# Patient Record
Sex: Male | Born: 1945 | Race: White | Hispanic: No | Smoking: Former smoker
Health system: Southern US, Community
[De-identification: ages and names within clinical notes are randomized; demographics above are authoritative.]

---

## 2006-03-31 ENCOUNTER — Ambulatory Visit (HOSPITAL_COMMUNITY): Admission: RE | Admit: 2006-03-31 | Discharge: 2006-03-31 | Payer: Self-pay | Admitting: Orthopedic Surgery

## 2006-12-28 ENCOUNTER — Ambulatory Visit (HOSPITAL_BASED_OUTPATIENT_CLINIC_OR_DEPARTMENT_OTHER): Admission: RE | Admit: 2006-12-28 | Discharge: 2006-12-28 | Payer: Self-pay | Admitting: Urology

## 2007-07-21 ENCOUNTER — Encounter: Admission: RE | Admit: 2007-07-21 | Discharge: 2007-07-21 | Payer: Self-pay | Admitting: Family Medicine

## 2007-07-23 ENCOUNTER — Inpatient Hospital Stay (HOSPITAL_COMMUNITY): Admission: EM | Admit: 2007-07-23 | Discharge: 2007-07-25 | Payer: Self-pay | Admitting: Emergency Medicine

## 2010-12-08 NOTE — Op Note (Signed)
Raymond Hunter, Raymond Hunter                   ACCOUNT NO.:  [id number]   MEDICAL RECORD NO.:  [id number]          PATIENT TYPE:  AMB   LOCATION:  [facility]                         FACILITY:  [facility]   PHYSICIAN:  Valetta Fuller, M.D.  DATE OF BIRTH:  April 04, 1946   DATE OF PROCEDURE:  12/28/2006  DATE OF DISCHARGE:                               OPERATIVE REPORT   PREOPERATIVE DIAGNOSIS:  Bladder neck obstruction, elevated postvoid  residual.   POSTOPERATIVE DIAGNOSIS:  Bladder neck obstruction, elevated postvoid  residual.   PROCEDURE:  1. Cystoscopy with transurethral incision of bladder neck and      prostate.  2. Urethral dilation.   SURGEON:  Valetta Fuller, M.D.   RESIDENT:  Terie Purser, MD   ANESTHESIA:  General.   COMPLICATIONS:  None.   DRAINS:  20-French Foley catheter.   DISPOSITION:  Stable to post anesthesia care unit.   INDICATIONS FOR PROCEDURE:  Mr. Bromell is a 65 year old gentleman who was  originally seen for evaluation of hematuria.  During his evaluation he  was found to have a significantly distended bladder.  He has been  evaluated with imaging and urodynamic study.  He has cystoscopic  evidence of a narrowed bladder neck.  He also has findings of hypotonic  bladder.  He has been on medical therapy.  He was counseled regarding  transurethral incision of the prostate.  He has agreed to proceed after  a full discussion of benefits, risks of procedure.   DESCRIPTION OF PROCEDURE:  The patient was brought to the operating room  properly identified.  Time-out performed to confirm correct patient and  procedure.  Administered general anesthesia given preoperative  antibiotics and placed in dorsal lithotomy position and prepped and  draped sterile fashion.  He did have a tight meatus that was unable to  accept the 28-French resectoscope.  We therefore serially dilated the  urethra using Sissy Hoff sounds to 30-French.  At this point we then did  introduce the 33 French  resectoscope into the bladder.  The bladder was  then carefully evaluated.  Both ureteral orifices were in their normal  anatomic position and both effluxing clear urine.  He did have a very  large capacious bladder with the capacity of approximately 2 liters.  We  then examined the bladder neck and prostate.  He did have a ridge at the  bladder neck.  He did not have significant lateral obstruction in the  prostatic urethra.  The location of the ureteral orifice was noted.   We then proceeded with transurethral incision.  This was performed with  the [employer].  We made a cut at the 6 o'clock position through the  ridge of the bladder neck and this was carried down to the level of the  verumontanum.  We continued to do this until the bladder neck opened  nicely.  Hemostasis was obtained.  We then carefully examined the  posterior urethra.  The channel was wide open and had a flat appearance  from the verumontanum into the bladder.  At this point the scope  was  removed and a Foley catheter was placed easily.  Return of clear urine  was noted.  The patient was then awoken from anesthesia and transported  to room in stable condition.  There were no complications throughout the  case.  Please note Dr. Isabel Caprice was present and participated in all  aspects of this procedure as primary surgeon.     ______________________________  Terie Purser, MD      Valetta Fuller, M.D.  Electronically Signed    JH/MEDQ  D:  12/28/2006  T:  12/29/2006  Job:  [id number]

## 2010-12-08 NOTE — Consult Note (Signed)
Raymond Hunter, HAGINS                   ACCOUNT NO.:  [id number]   MEDICAL RECORD NO.:  [id number]          PATIENT TYPE:  INP   LOCATION:  1418                         FACILITY:  [facility]   PHYSICIAN:  Heloise Purpura, MD      DATE OF BIRTH:  10-Apr-1946   DATE OF CONSULTATION:  07/23/2007  DATE OF DISCHARGE:                                 CONSULTATION   REQUESTING PHYSICIAN:  Dr. Valma Cava.   REASON FOR CONSULTATION:  Right testicular pain.   HISTORY:  Raymond Hunter is a 65 year old gentleman who has been followed by  Dr. Isabel Caprice for his urologic care in the past.  He developed right  testicular pain along with right lower back pain which began on December  25.  This subsequently progressed, resulting in the patient presenting  for evaluation by his primary care physician on the 26th.  He underwent  a testicular ultrasound which demonstrated findings consistent with an  epididymitis, and he was begun on Bactrim at that time.  His symptoms  have progressively worsened; and due to increased pain and discomfort,  he returned to the emergency department this morning for further  evaluation.  He denies any dysuria, urinary urgency, frequency, or  hematuria.  He again has had some low back pain but no flank pain.  He  denies nausea, vomiting, or fever.  He denies a history of  nephrolithiasis or GU malignancy.  He does have a history of urinary  retention, and has undergone a transurethral incision of the prostate by  Dr. Isabel Caprice in April.   PAST MEDICAL HISTORY:  1. Diabetes.  2. Arthritis.  3. Dyslipidemia.   PAST SURGICAL HISTORY:  1. Back surgery.  2. Transurethral incision of the prostate.   MEDICATIONS:  1. Metformin.  2. Glipizide.  3. Cyclobenzaprine.  4. Bactrim.  5. Vicodin.   ALLERGIES:  SUDAFED which resulted in extreme dizziness   FAMILY HISTORY:  No GU malignancy.   SOCIAL HISTORY:  The patient quit smoking approximately 40 years ago.   REVIEW OF SYSTEMS:  All systems  are reviewed and are negative except as  in the history.   PHYSICAL EXAM:  VITAL SIGNS:  Temperature 98.6, respirations 20, blood  pressure 151/96, heart rate 101.  CONSTITUTIONAL:  Well-nourished, well-developed, age-appropriate male in  no acute distress.  HEENT:  Normocephalic, atraumatic.  CARDIOVASCULAR:  Regular rate and rhythm.  LUNGS:  Normal respiratory effort.  ABDOMEN:  Mildly obese, soft, nontender, and nondistended without  abdominal masses.  BACK:  No CVA tenderness.  GENITOURINARY:  The patient has a normal male phallus and normal  urethral meatus.  No penile lesions.  The left testis is palpably normal  without evidence for mass or tenderness.  Normal epididymis on the left.  The right testicle is significantly swollen.  There is no induration.  The right testicle is exquisitely tender on palpation along with the  right epididymis.  There are no scrotal skin abnormalities.  DIGITAL RECTAL EXAM:  Normal sphincter tone without rectal masses.  The  prostate is nontender and without nodularity.  EXTREMITIES:  No edema.  NEUROLOGIC:  Grossly intact.  PSYCHIATRIC:  Normal mood and affect   LABORATORY DATA:  Urinalysis--too numerous count white blood cells, 3-6  red blood cells, and many bacteria; this has been sent for urine  culture.  White blood count is 17.8.  blood glucose is elevated at 197,  and serum creatinine is 1.17.   RADIOLOGIC IMAGING:  The patient did undergo scrotal ultrasonography  both on December 26 and, again, on December 28.  His ultrasound on  December 26, again, demonstrated heterogeneous right epididymis with  enlargement consistent with epididymitis.  No obvious abscess was seen.  These findings were also consistent with the patient's ultrasound today.  Again, no obvious abscess was identified.   IMPRESSION:  Complicated epididymo-orchitis.   PLAN:  Due to the fact that the patient has not been clinically  improving on antibiotic therapy, as  well as the fact that he is  immunocompromised being a diabetic, I have recommended that he be placed  in the hospital for broad spectrum intravenous antibiotic therapy.  A  urine culture has been sent.  Dr. Isabel Caprice will be notified of the  patient's admission.      Heloise Purpura, MD  Electronically Signed     LB/MEDQ  D:  07/23/2007  T:  07/24/2007  Job:  [id number]   cc:   Dr. Valma Cava

## 2010-12-11 NOTE — Discharge Summary (Signed)
Raymond Hunter, Raymond Hunter                   ACCOUNT NO.:  [id number]   MEDICAL RECORD NO.:  [id number]          PATIENT TYPE:  INP   LOCATION:  1418                         [facility]ACILITY[facility]   PHYSICIAN:  Valetta Fuller, M.D.  DATE OF BIRTH:  Jan 26, 1946   DATE OF ADMISSION:  07/23/2007  DATE OF DISCHARGE:  07/25/2007                               DISCHARGE SUMMARY   DISCHARGE DIAGNOSIS:  1. Epididymal orchitis.  2. Diabetes mellitus type 2.  3. Urinary retention with neurogenic bladder.   HOSPITAL COURSE:  Raymond Hunter is a 65 year old male.  We have followed him  for some time with problems with voiding dysfunction.  The patient  previously had urinary retention.  He had very large postvoid residuals.  A one point the patient had as much as 2 liters in his bladder.  Urodynamics revealed a hypotonic acontractile bladder.  There was also  some potential component of obstruction.   Back in June of 2008, the patient underwent a transurethral incision of  his prostate and bladder neck.  This resulted in improved voiding.  He  is still known to have postvoid residuals in the 500-600 mL range, but  is otherwise doing relatively well clinically.  Last time he was seen in  the office, things were relatively stable.  The patient was admitted by  my partner Dr. Laverle Patter.  He had developed a right-sided testicular  discomfort along with some low-back pain beginning 2-3 days prior to  admission.  He was evaluated by his primary care physician 2 days prior  to admission, and had a testicular ultrasound which showed findings  consistent with epididymitis, and he was started on Bactrim.  The  patient's symptoms progressively worsened with increased pain and  swelling.  He presented to the [facility] Emergency Room where he was  assessed by Dr. Laverle Patter.  There, he was found to be afebrile.  White  blood cell count, however, was elevated at 17,800.  His blood sugar was  197.  Repeat scrotal ultrasound was,  again, performed.  This showed some  heterogenicity of the right epididymis with enlargement and increased  blood flow consistent with epididymitis.  No evidence of an abscess.  The patient was admitted for broad-spectrum antibiotics.  Clinically he  was felt to have epididymal orchitis.   The patient was admitted and started on ciprofloxacin as well as some  supportive care.  The patient voided about 200 mL, and had a postvoid  residual of approximately 1000 mL.  For that reason a Foley catheter was  inserted and approximately 1000 mL of urine was obtained.  I saw the  patient for the first time on hospital day #1.  At that time he was  feeling better.  He remained afebrile with normal vital signs.  He  continued to have an enlarged and tender right testis and epididymis.  White blood cell count was down to 15,000 and blood sugars were down to  the 140-150 range.  The patient subsequently had his catheter removed,  and was able to void.  He was restarted  on his Flomax..   On hospital day #2 he remained afebrile with improved testicular  discomfort.  His urine culture actually was negative.  The patient was  discharged on July 25, 2007.  He was clinically improved at that  time.   DISPOSITION:  The patient was discharged to home with a 10-day course of  ciprofloxacin.  He will remain on his Flomax.  He will follow up with me  in approximately 2 weeks.           ______________________________  Valetta Fuller, M.D.  Electronically Signed     DSG/MEDQ  D:  09/17/2007  T:  09/18/2007  Job:  [id number]

## 2011-04-30 LAB — URINALYSIS, ROUTINE W REFLEX MICROSCOPIC
Bilirubin Urine: NEGATIVE
Protein, ur: 30 — AB
Urobilinogen, UA: 0.2

## 2011-04-30 LAB — URINE MICROSCOPIC-ADD ON

## 2011-04-30 LAB — COMPREHENSIVE METABOLIC PANEL
ALT: 17
Alkaline Phosphatase: 83
BUN: 15
CO2: 22
Creatinine, Ser: 1.17
Glucose, Bld: 197 — ABNORMAL HIGH

## 2011-04-30 LAB — CBC
Hemoglobin: 14.9
Platelets: 171
Platelets: 214
RBC: 4.26
RBC: 4.73
RDW: 12.7

## 2011-04-30 LAB — DIFFERENTIAL
Basophils Absolute: 0
Basophils Absolute: 0.1
Basophils Relative: 0
Eosinophils Absolute: 0.1
Eosinophils Relative: 1
Lymphocytes Relative: 18
Lymphs Abs: 2.7
Monocytes Absolute: 1.3 — ABNORMAL HIGH
Monocytes Absolute: 1.4 — ABNORMAL HIGH
Monocytes Relative: 9
Neutro Abs: 13 — ABNORMAL HIGH
Neutrophils Relative %: 72
Neutrophils Relative %: 73

## 2011-04-30 LAB — URINE CULTURE: Colony Count: 2000

## 2011-05-13 LAB — POCT I-STAT 4, (NA,K, GLUC, HGB,HCT)
Glucose, Bld: 222 — ABNORMAL HIGH
HCT: 48
Hemoglobin: 16.3
Operator id: 114531

## 2014-08-01 DIAGNOSIS — G309 Alzheimer's disease, unspecified: Secondary | ICD-10-CM | POA: Diagnosis not present

## 2014-08-01 DIAGNOSIS — L820 Inflamed seborrheic keratosis: Secondary | ICD-10-CM | POA: Diagnosis not present

## 2014-09-13 DIAGNOSIS — S79912A Unspecified injury of left hip, initial encounter: Secondary | ICD-10-CM | POA: Diagnosis not present

## 2014-09-13 DIAGNOSIS — R739 Hyperglycemia, unspecified: Secondary | ICD-10-CM | POA: Diagnosis not present

## 2014-09-13 DIAGNOSIS — G8929 Other chronic pain: Secondary | ICD-10-CM | POA: Diagnosis not present

## 2014-09-13 DIAGNOSIS — M25552 Pain in left hip: Secondary | ICD-10-CM | POA: Diagnosis not present

## 2014-09-13 DIAGNOSIS — R7309 Other abnormal glucose: Secondary | ICD-10-CM | POA: Diagnosis not present

## 2014-09-13 DIAGNOSIS — M6282 Rhabdomyolysis: Secondary | ICD-10-CM | POA: Diagnosis not present

## 2014-09-13 DIAGNOSIS — N390 Urinary tract infection, site not specified: Secondary | ICD-10-CM | POA: Diagnosis not present

## 2014-09-13 DIAGNOSIS — E1165 Type 2 diabetes mellitus with hyperglycemia: Secondary | ICD-10-CM | POA: Diagnosis not present

## 2014-09-13 DIAGNOSIS — M545 Low back pain: Secondary | ICD-10-CM | POA: Diagnosis not present

## 2014-09-13 DIAGNOSIS — S0990XA Unspecified injury of head, initial encounter: Secondary | ICD-10-CM | POA: Diagnosis not present

## 2014-09-13 DIAGNOSIS — S3992XA Unspecified injury of lower back, initial encounter: Secondary | ICD-10-CM | POA: Diagnosis not present

## 2014-09-13 DIAGNOSIS — R509 Fever, unspecified: Secondary | ICD-10-CM | POA: Diagnosis not present

## 2014-09-14 DIAGNOSIS — B952 Enterococcus as the cause of diseases classified elsewhere: Secondary | ICD-10-CM | POA: Diagnosis not present

## 2014-09-14 DIAGNOSIS — I517 Cardiomegaly: Secondary | ICD-10-CM | POA: Diagnosis not present

## 2014-09-14 DIAGNOSIS — G8929 Other chronic pain: Secondary | ICD-10-CM | POA: Diagnosis not present

## 2014-09-14 DIAGNOSIS — R509 Fever, unspecified: Secondary | ICD-10-CM | POA: Diagnosis not present

## 2014-09-14 DIAGNOSIS — S3992XA Unspecified injury of lower back, initial encounter: Secondary | ICD-10-CM | POA: Diagnosis not present

## 2014-09-14 DIAGNOSIS — R262 Difficulty in walking, not elsewhere classified: Secondary | ICD-10-CM | POA: Diagnosis not present

## 2014-09-14 DIAGNOSIS — Z7982 Long term (current) use of aspirin: Secondary | ICD-10-CM | POA: Diagnosis not present

## 2014-09-14 DIAGNOSIS — G9341 Metabolic encephalopathy: Secondary | ICD-10-CM | POA: Diagnosis not present

## 2014-09-14 DIAGNOSIS — R0602 Shortness of breath: Secondary | ICD-10-CM | POA: Diagnosis not present

## 2014-09-14 DIAGNOSIS — G4700 Insomnia, unspecified: Secondary | ICD-10-CM | POA: Diagnosis not present

## 2014-09-14 DIAGNOSIS — M25552 Pain in left hip: Secondary | ICD-10-CM | POA: Diagnosis not present

## 2014-09-14 DIAGNOSIS — R488 Other symbolic dysfunctions: Secondary | ICD-10-CM | POA: Diagnosis not present

## 2014-09-14 DIAGNOSIS — R339 Retention of urine, unspecified: Secondary | ICD-10-CM | POA: Diagnosis not present

## 2014-09-14 DIAGNOSIS — Z23 Encounter for immunization: Secondary | ICD-10-CM | POA: Diagnosis not present

## 2014-09-14 DIAGNOSIS — Z794 Long term (current) use of insulin: Secondary | ICD-10-CM | POA: Diagnosis not present

## 2014-09-14 DIAGNOSIS — N390 Urinary tract infection, site not specified: Secondary | ICD-10-CM | POA: Diagnosis not present

## 2014-09-14 DIAGNOSIS — I471 Supraventricular tachycardia: Secondary | ICD-10-CM | POA: Diagnosis not present

## 2014-09-14 DIAGNOSIS — S0990XA Unspecified injury of head, initial encounter: Secondary | ICD-10-CM | POA: Diagnosis not present

## 2014-09-14 DIAGNOSIS — R9431 Abnormal electrocardiogram [ECG] [EKG]: Secondary | ICD-10-CM | POA: Diagnosis not present

## 2014-09-14 DIAGNOSIS — R5383 Other fatigue: Secondary | ICD-10-CM | POA: Diagnosis not present

## 2014-09-14 DIAGNOSIS — R2689 Other abnormalities of gait and mobility: Secondary | ICD-10-CM | POA: Diagnosis not present

## 2014-09-14 DIAGNOSIS — S79912A Unspecified injury of left hip, initial encounter: Secondary | ICD-10-CM | POA: Diagnosis not present

## 2014-09-14 DIAGNOSIS — M1990 Unspecified osteoarthritis, unspecified site: Secondary | ICD-10-CM | POA: Diagnosis not present

## 2014-09-14 DIAGNOSIS — M6281 Muscle weakness (generalized): Secondary | ICD-10-CM | POA: Diagnosis not present

## 2014-09-14 DIAGNOSIS — M545 Low back pain: Secondary | ICD-10-CM | POA: Diagnosis not present

## 2014-09-14 DIAGNOSIS — R278 Other lack of coordination: Secondary | ICD-10-CM | POA: Diagnosis not present

## 2014-09-14 DIAGNOSIS — G309 Alzheimer's disease, unspecified: Secondary | ICD-10-CM | POA: Diagnosis not present

## 2014-09-14 DIAGNOSIS — R2681 Unsteadiness on feet: Secondary | ICD-10-CM | POA: Diagnosis not present

## 2014-09-14 DIAGNOSIS — F0390 Unspecified dementia without behavioral disturbance: Secondary | ICD-10-CM | POA: Diagnosis not present

## 2014-09-14 DIAGNOSIS — R338 Other retention of urine: Secondary | ICD-10-CM | POA: Diagnosis not present

## 2014-09-14 DIAGNOSIS — I10 Essential (primary) hypertension: Secondary | ICD-10-CM | POA: Diagnosis not present

## 2014-09-14 DIAGNOSIS — R279 Unspecified lack of coordination: Secondary | ICD-10-CM | POA: Diagnosis not present

## 2014-09-14 DIAGNOSIS — N3001 Acute cystitis with hematuria: Secondary | ICD-10-CM | POA: Diagnosis not present

## 2014-09-14 DIAGNOSIS — N401 Enlarged prostate with lower urinary tract symptoms: Secondary | ICD-10-CM | POA: Diagnosis not present

## 2014-09-14 DIAGNOSIS — Z7401 Bed confinement status: Secondary | ICD-10-CM | POA: Diagnosis not present

## 2014-09-14 DIAGNOSIS — E1165 Type 2 diabetes mellitus with hyperglycemia: Secondary | ICD-10-CM | POA: Diagnosis not present

## 2014-09-14 DIAGNOSIS — T796XXD Traumatic ischemia of muscle, subsequent encounter: Secondary | ICD-10-CM | POA: Diagnosis not present

## 2014-09-14 DIAGNOSIS — R54 Age-related physical debility: Secondary | ICD-10-CM | POA: Diagnosis not present

## 2014-09-14 DIAGNOSIS — M6282 Rhabdomyolysis: Secondary | ICD-10-CM | POA: Diagnosis not present

## 2014-09-14 DIAGNOSIS — G92 Toxic encephalopathy: Secondary | ICD-10-CM | POA: Diagnosis not present

## 2014-09-17 DIAGNOSIS — G4700 Insomnia, unspecified: Secondary | ICD-10-CM | POA: Diagnosis not present

## 2014-09-17 DIAGNOSIS — F0390 Unspecified dementia without behavioral disturbance: Secondary | ICD-10-CM | POA: Diagnosis not present

## 2014-09-17 DIAGNOSIS — I10 Essential (primary) hypertension: Secondary | ICD-10-CM | POA: Diagnosis not present

## 2014-09-17 DIAGNOSIS — G309 Alzheimer's disease, unspecified: Secondary | ICD-10-CM | POA: Diagnosis not present

## 2014-09-17 DIAGNOSIS — G92 Toxic encephalopathy: Secondary | ICD-10-CM | POA: Diagnosis not present

## 2014-09-17 DIAGNOSIS — N400 Enlarged prostate without lower urinary tract symptoms: Secondary | ICD-10-CM | POA: Diagnosis not present

## 2014-09-17 DIAGNOSIS — R2681 Unsteadiness on feet: Secondary | ICD-10-CM | POA: Diagnosis not present

## 2014-09-17 DIAGNOSIS — R2689 Other abnormalities of gait and mobility: Secondary | ICD-10-CM | POA: Diagnosis not present

## 2014-09-17 DIAGNOSIS — N401 Enlarged prostate with lower urinary tract symptoms: Secondary | ICD-10-CM | POA: Diagnosis not present

## 2014-09-17 DIAGNOSIS — M6281 Muscle weakness (generalized): Secondary | ICD-10-CM | POA: Diagnosis not present

## 2014-09-17 DIAGNOSIS — R488 Other symbolic dysfunctions: Secondary | ICD-10-CM | POA: Diagnosis not present

## 2014-09-17 DIAGNOSIS — R5383 Other fatigue: Secondary | ICD-10-CM | POA: Diagnosis not present

## 2014-09-17 DIAGNOSIS — I517 Cardiomegaly: Secondary | ICD-10-CM | POA: Diagnosis not present

## 2014-09-17 DIAGNOSIS — R0602 Shortness of breath: Secondary | ICD-10-CM | POA: Diagnosis not present

## 2014-09-17 DIAGNOSIS — R338 Other retention of urine: Secondary | ICD-10-CM | POA: Diagnosis not present

## 2014-09-17 DIAGNOSIS — R279 Unspecified lack of coordination: Secondary | ICD-10-CM | POA: Diagnosis not present

## 2014-09-17 DIAGNOSIS — R54 Age-related physical debility: Secondary | ICD-10-CM | POA: Diagnosis not present

## 2014-09-17 DIAGNOSIS — R278 Other lack of coordination: Secondary | ICD-10-CM | POA: Diagnosis not present

## 2014-09-17 DIAGNOSIS — E1165 Type 2 diabetes mellitus with hyperglycemia: Secondary | ICD-10-CM | POA: Diagnosis not present

## 2014-09-17 DIAGNOSIS — Z7401 Bed confinement status: Secondary | ICD-10-CM | POA: Diagnosis not present

## 2014-09-17 DIAGNOSIS — N3001 Acute cystitis with hematuria: Secondary | ICD-10-CM | POA: Diagnosis not present

## 2014-10-26 DIAGNOSIS — N400 Enlarged prostate without lower urinary tract symptoms: Secondary | ICD-10-CM | POA: Diagnosis not present

## 2014-10-26 DIAGNOSIS — G309 Alzheimer's disease, unspecified: Secondary | ICD-10-CM | POA: Diagnosis not present

## 2014-10-26 DIAGNOSIS — G4700 Insomnia, unspecified: Secondary | ICD-10-CM | POA: Diagnosis not present

## 2014-10-30 DIAGNOSIS — E119 Type 2 diabetes mellitus without complications: Secondary | ICD-10-CM | POA: Diagnosis not present

## 2014-10-30 DIAGNOSIS — T83498A Other mechanical complication of other prosthetic devices, implants and grafts of genital tract, initial encounter: Secondary | ICD-10-CM | POA: Diagnosis not present

## 2014-10-30 DIAGNOSIS — Z7982 Long term (current) use of aspirin: Secondary | ICD-10-CM | POA: Diagnosis not present

## 2014-10-30 DIAGNOSIS — N3090 Cystitis, unspecified without hematuria: Secondary | ICD-10-CM | POA: Diagnosis not present

## 2014-10-30 DIAGNOSIS — R339 Retention of urine, unspecified: Secondary | ICD-10-CM | POA: Diagnosis not present

## 2014-10-30 DIAGNOSIS — N3080 Other cystitis without hematuria: Secondary | ICD-10-CM | POA: Diagnosis not present

## 2014-10-30 DIAGNOSIS — Z794 Long term (current) use of insulin: Secondary | ICD-10-CM | POA: Diagnosis not present

## 2014-10-30 DIAGNOSIS — I10 Essential (primary) hypertension: Secondary | ICD-10-CM | POA: Diagnosis not present

## 2014-10-30 DIAGNOSIS — N501 Vascular disorders of male genital organs: Secondary | ICD-10-CM | POA: Diagnosis not present

## 2014-11-13 DIAGNOSIS — N390 Urinary tract infection, site not specified: Secondary | ICD-10-CM | POA: Diagnosis not present

## 2014-11-15 DIAGNOSIS — I10 Essential (primary) hypertension: Secondary | ICD-10-CM | POA: Diagnosis not present

## 2014-11-15 DIAGNOSIS — E119 Type 2 diabetes mellitus without complications: Secondary | ICD-10-CM | POA: Diagnosis not present

## 2014-11-15 DIAGNOSIS — Z79899 Other long term (current) drug therapy: Secondary | ICD-10-CM | POA: Diagnosis not present

## 2014-11-27 DIAGNOSIS — N390 Urinary tract infection, site not specified: Secondary | ICD-10-CM | POA: Diagnosis not present

## 2014-11-30 DIAGNOSIS — N400 Enlarged prostate without lower urinary tract symptoms: Secondary | ICD-10-CM | POA: Diagnosis not present

## 2014-11-30 DIAGNOSIS — E119 Type 2 diabetes mellitus without complications: Secondary | ICD-10-CM | POA: Diagnosis not present

## 2014-11-30 DIAGNOSIS — G309 Alzheimer's disease, unspecified: Secondary | ICD-10-CM | POA: Diagnosis not present

## 2014-12-11 DIAGNOSIS — N390 Urinary tract infection, site not specified: Secondary | ICD-10-CM | POA: Diagnosis not present

## 2014-12-25 DIAGNOSIS — N390 Urinary tract infection, site not specified: Secondary | ICD-10-CM | POA: Diagnosis not present

## 2014-12-29 DIAGNOSIS — R40241 Glasgow coma scale score 13-15: Secondary | ICD-10-CM | POA: Diagnosis not present

## 2014-12-29 DIAGNOSIS — J069 Acute upper respiratory infection, unspecified: Secondary | ICD-10-CM | POA: Diagnosis not present

## 2014-12-29 DIAGNOSIS — N390 Urinary tract infection, site not specified: Secondary | ICD-10-CM | POA: Diagnosis not present

## 2014-12-29 DIAGNOSIS — R05 Cough: Secondary | ICD-10-CM | POA: Diagnosis not present

## 2015-01-04 DIAGNOSIS — R339 Retention of urine, unspecified: Secondary | ICD-10-CM | POA: Diagnosis not present

## 2015-01-04 DIAGNOSIS — N400 Enlarged prostate without lower urinary tract symptoms: Secondary | ICD-10-CM | POA: Diagnosis not present

## 2015-01-04 DIAGNOSIS — G309 Alzheimer's disease, unspecified: Secondary | ICD-10-CM | POA: Diagnosis not present

## 2015-01-13 DIAGNOSIS — R5383 Other fatigue: Secondary | ICD-10-CM | POA: Diagnosis not present

## 2015-01-13 DIAGNOSIS — R2689 Other abnormalities of gait and mobility: Secondary | ICD-10-CM | POA: Diagnosis not present

## 2015-01-13 DIAGNOSIS — N3001 Acute cystitis with hematuria: Secondary | ICD-10-CM | POA: Diagnosis not present

## 2015-01-13 DIAGNOSIS — N401 Enlarged prostate with lower urinary tract symptoms: Secondary | ICD-10-CM | POA: Diagnosis not present

## 2015-01-13 DIAGNOSIS — E1165 Type 2 diabetes mellitus with hyperglycemia: Secondary | ICD-10-CM | POA: Diagnosis not present

## 2015-01-13 DIAGNOSIS — R278 Other lack of coordination: Secondary | ICD-10-CM | POA: Diagnosis not present

## 2015-01-13 DIAGNOSIS — G4700 Insomnia, unspecified: Secondary | ICD-10-CM | POA: Diagnosis not present

## 2015-01-13 DIAGNOSIS — I10 Essential (primary) hypertension: Secondary | ICD-10-CM | POA: Diagnosis not present

## 2015-01-13 DIAGNOSIS — R488 Other symbolic dysfunctions: Secondary | ICD-10-CM | POA: Diagnosis not present

## 2015-01-13 DIAGNOSIS — J40 Bronchitis, not specified as acute or chronic: Secondary | ICD-10-CM | POA: Diagnosis not present

## 2015-01-13 DIAGNOSIS — R54 Age-related physical debility: Secondary | ICD-10-CM | POA: Diagnosis not present

## 2015-01-13 DIAGNOSIS — R338 Other retention of urine: Secondary | ICD-10-CM | POA: Diagnosis not present

## 2015-01-13 DIAGNOSIS — M6281 Muscle weakness (generalized): Secondary | ICD-10-CM | POA: Diagnosis not present

## 2015-01-13 DIAGNOSIS — R2681 Unsteadiness on feet: Secondary | ICD-10-CM | POA: Diagnosis not present

## 2015-01-14 DIAGNOSIS — J40 Bronchitis, not specified as acute or chronic: Secondary | ICD-10-CM | POA: Diagnosis not present

## 2015-01-14 DIAGNOSIS — M6281 Muscle weakness (generalized): Secondary | ICD-10-CM | POA: Diagnosis not present

## 2015-01-14 DIAGNOSIS — N3001 Acute cystitis with hematuria: Secondary | ICD-10-CM | POA: Diagnosis not present

## 2015-01-14 DIAGNOSIS — R2689 Other abnormalities of gait and mobility: Secondary | ICD-10-CM | POA: Diagnosis not present

## 2015-01-14 DIAGNOSIS — E1165 Type 2 diabetes mellitus with hyperglycemia: Secondary | ICD-10-CM | POA: Diagnosis not present

## 2015-01-14 DIAGNOSIS — R338 Other retention of urine: Secondary | ICD-10-CM | POA: Diagnosis not present

## 2015-01-14 DIAGNOSIS — R2681 Unsteadiness on feet: Secondary | ICD-10-CM | POA: Diagnosis not present

## 2015-01-14 DIAGNOSIS — I10 Essential (primary) hypertension: Secondary | ICD-10-CM | POA: Diagnosis not present

## 2015-01-14 DIAGNOSIS — R278 Other lack of coordination: Secondary | ICD-10-CM | POA: Diagnosis not present

## 2015-01-14 DIAGNOSIS — R54 Age-related physical debility: Secondary | ICD-10-CM | POA: Diagnosis not present

## 2015-01-14 DIAGNOSIS — R488 Other symbolic dysfunctions: Secondary | ICD-10-CM | POA: Diagnosis not present

## 2015-01-14 DIAGNOSIS — G4700 Insomnia, unspecified: Secondary | ICD-10-CM | POA: Diagnosis not present

## 2015-01-14 DIAGNOSIS — R5383 Other fatigue: Secondary | ICD-10-CM | POA: Diagnosis not present

## 2015-01-14 DIAGNOSIS — N401 Enlarged prostate with lower urinary tract symptoms: Secondary | ICD-10-CM | POA: Diagnosis not present

## 2015-01-15 DIAGNOSIS — M6281 Muscle weakness (generalized): Secondary | ICD-10-CM | POA: Diagnosis not present

## 2015-01-15 DIAGNOSIS — R2681 Unsteadiness on feet: Secondary | ICD-10-CM | POA: Diagnosis not present

## 2015-01-15 DIAGNOSIS — J40 Bronchitis, not specified as acute or chronic: Secondary | ICD-10-CM | POA: Diagnosis not present

## 2015-01-15 DIAGNOSIS — I10 Essential (primary) hypertension: Secondary | ICD-10-CM | POA: Diagnosis not present

## 2015-01-15 DIAGNOSIS — R2689 Other abnormalities of gait and mobility: Secondary | ICD-10-CM | POA: Diagnosis not present

## 2015-01-15 DIAGNOSIS — E1165 Type 2 diabetes mellitus with hyperglycemia: Secondary | ICD-10-CM | POA: Diagnosis not present

## 2015-01-15 DIAGNOSIS — R278 Other lack of coordination: Secondary | ICD-10-CM | POA: Diagnosis not present

## 2015-01-15 DIAGNOSIS — N401 Enlarged prostate with lower urinary tract symptoms: Secondary | ICD-10-CM | POA: Diagnosis not present

## 2015-01-15 DIAGNOSIS — G4700 Insomnia, unspecified: Secondary | ICD-10-CM | POA: Diagnosis not present

## 2015-01-15 DIAGNOSIS — R488 Other symbolic dysfunctions: Secondary | ICD-10-CM | POA: Diagnosis not present

## 2015-01-15 DIAGNOSIS — R338 Other retention of urine: Secondary | ICD-10-CM | POA: Diagnosis not present

## 2015-01-15 DIAGNOSIS — R5383 Other fatigue: Secondary | ICD-10-CM | POA: Diagnosis not present

## 2015-01-15 DIAGNOSIS — N3001 Acute cystitis with hematuria: Secondary | ICD-10-CM | POA: Diagnosis not present

## 2015-01-15 DIAGNOSIS — R54 Age-related physical debility: Secondary | ICD-10-CM | POA: Diagnosis not present

## 2015-01-16 DIAGNOSIS — M6281 Muscle weakness (generalized): Secondary | ICD-10-CM | POA: Diagnosis not present

## 2015-01-16 DIAGNOSIS — R54 Age-related physical debility: Secondary | ICD-10-CM | POA: Diagnosis not present

## 2015-01-16 DIAGNOSIS — N3001 Acute cystitis with hematuria: Secondary | ICD-10-CM | POA: Diagnosis not present

## 2015-01-16 DIAGNOSIS — R2681 Unsteadiness on feet: Secondary | ICD-10-CM | POA: Diagnosis not present

## 2015-01-16 DIAGNOSIS — R5383 Other fatigue: Secondary | ICD-10-CM | POA: Diagnosis not present

## 2015-01-16 DIAGNOSIS — G4700 Insomnia, unspecified: Secondary | ICD-10-CM | POA: Diagnosis not present

## 2015-01-16 DIAGNOSIS — R2689 Other abnormalities of gait and mobility: Secondary | ICD-10-CM | POA: Diagnosis not present

## 2015-01-16 DIAGNOSIS — R338 Other retention of urine: Secondary | ICD-10-CM | POA: Diagnosis not present

## 2015-01-16 DIAGNOSIS — R278 Other lack of coordination: Secondary | ICD-10-CM | POA: Diagnosis not present

## 2015-01-16 DIAGNOSIS — I10 Essential (primary) hypertension: Secondary | ICD-10-CM | POA: Diagnosis not present

## 2015-01-16 DIAGNOSIS — R488 Other symbolic dysfunctions: Secondary | ICD-10-CM | POA: Diagnosis not present

## 2015-01-16 DIAGNOSIS — E1165 Type 2 diabetes mellitus with hyperglycemia: Secondary | ICD-10-CM | POA: Diagnosis not present

## 2015-01-16 DIAGNOSIS — J40 Bronchitis, not specified as acute or chronic: Secondary | ICD-10-CM | POA: Diagnosis not present

## 2015-01-16 DIAGNOSIS — N401 Enlarged prostate with lower urinary tract symptoms: Secondary | ICD-10-CM | POA: Diagnosis not present

## 2015-01-17 DIAGNOSIS — G4700 Insomnia, unspecified: Secondary | ICD-10-CM | POA: Diagnosis not present

## 2015-01-17 DIAGNOSIS — N401 Enlarged prostate with lower urinary tract symptoms: Secondary | ICD-10-CM | POA: Diagnosis not present

## 2015-01-17 DIAGNOSIS — E1165 Type 2 diabetes mellitus with hyperglycemia: Secondary | ICD-10-CM | POA: Diagnosis not present

## 2015-01-17 DIAGNOSIS — J40 Bronchitis, not specified as acute or chronic: Secondary | ICD-10-CM | POA: Diagnosis not present

## 2015-01-17 DIAGNOSIS — R2681 Unsteadiness on feet: Secondary | ICD-10-CM | POA: Diagnosis not present

## 2015-01-17 DIAGNOSIS — R5383 Other fatigue: Secondary | ICD-10-CM | POA: Diagnosis not present

## 2015-01-17 DIAGNOSIS — I10 Essential (primary) hypertension: Secondary | ICD-10-CM | POA: Diagnosis not present

## 2015-01-17 DIAGNOSIS — R54 Age-related physical debility: Secondary | ICD-10-CM | POA: Diagnosis not present

## 2015-01-17 DIAGNOSIS — R338 Other retention of urine: Secondary | ICD-10-CM | POA: Diagnosis not present

## 2015-01-17 DIAGNOSIS — R488 Other symbolic dysfunctions: Secondary | ICD-10-CM | POA: Diagnosis not present

## 2015-01-17 DIAGNOSIS — R2689 Other abnormalities of gait and mobility: Secondary | ICD-10-CM | POA: Diagnosis not present

## 2015-01-17 DIAGNOSIS — N3001 Acute cystitis with hematuria: Secondary | ICD-10-CM | POA: Diagnosis not present

## 2015-01-17 DIAGNOSIS — M6281 Muscle weakness (generalized): Secondary | ICD-10-CM | POA: Diagnosis not present

## 2015-01-17 DIAGNOSIS — R278 Other lack of coordination: Secondary | ICD-10-CM | POA: Diagnosis not present

## 2015-01-20 DIAGNOSIS — J40 Bronchitis, not specified as acute or chronic: Secondary | ICD-10-CM | POA: Diagnosis not present

## 2015-01-20 DIAGNOSIS — R54 Age-related physical debility: Secondary | ICD-10-CM | POA: Diagnosis not present

## 2015-01-20 DIAGNOSIS — M6281 Muscle weakness (generalized): Secondary | ICD-10-CM | POA: Diagnosis not present

## 2015-01-20 DIAGNOSIS — N401 Enlarged prostate with lower urinary tract symptoms: Secondary | ICD-10-CM | POA: Diagnosis not present

## 2015-01-20 DIAGNOSIS — R2681 Unsteadiness on feet: Secondary | ICD-10-CM | POA: Diagnosis not present

## 2015-01-20 DIAGNOSIS — G4700 Insomnia, unspecified: Secondary | ICD-10-CM | POA: Diagnosis not present

## 2015-01-20 DIAGNOSIS — I10 Essential (primary) hypertension: Secondary | ICD-10-CM | POA: Diagnosis not present

## 2015-01-20 DIAGNOSIS — R488 Other symbolic dysfunctions: Secondary | ICD-10-CM | POA: Diagnosis not present

## 2015-01-20 DIAGNOSIS — R338 Other retention of urine: Secondary | ICD-10-CM | POA: Diagnosis not present

## 2015-01-20 DIAGNOSIS — R2689 Other abnormalities of gait and mobility: Secondary | ICD-10-CM | POA: Diagnosis not present

## 2015-01-20 DIAGNOSIS — N3001 Acute cystitis with hematuria: Secondary | ICD-10-CM | POA: Diagnosis not present

## 2015-01-20 DIAGNOSIS — E1165 Type 2 diabetes mellitus with hyperglycemia: Secondary | ICD-10-CM | POA: Diagnosis not present

## 2015-01-20 DIAGNOSIS — R5383 Other fatigue: Secondary | ICD-10-CM | POA: Diagnosis not present

## 2015-01-20 DIAGNOSIS — R278 Other lack of coordination: Secondary | ICD-10-CM | POA: Diagnosis not present

## 2015-01-21 DIAGNOSIS — R2681 Unsteadiness on feet: Secondary | ICD-10-CM | POA: Diagnosis not present

## 2015-01-21 DIAGNOSIS — M6281 Muscle weakness (generalized): Secondary | ICD-10-CM | POA: Diagnosis not present

## 2015-01-21 DIAGNOSIS — R278 Other lack of coordination: Secondary | ICD-10-CM | POA: Diagnosis not present

## 2015-01-21 DIAGNOSIS — R54 Age-related physical debility: Secondary | ICD-10-CM | POA: Diagnosis not present

## 2015-01-21 DIAGNOSIS — N401 Enlarged prostate with lower urinary tract symptoms: Secondary | ICD-10-CM | POA: Diagnosis not present

## 2015-01-21 DIAGNOSIS — J40 Bronchitis, not specified as acute or chronic: Secondary | ICD-10-CM | POA: Diagnosis not present

## 2015-01-21 DIAGNOSIS — R488 Other symbolic dysfunctions: Secondary | ICD-10-CM | POA: Diagnosis not present

## 2015-01-21 DIAGNOSIS — R5383 Other fatigue: Secondary | ICD-10-CM | POA: Diagnosis not present

## 2015-01-21 DIAGNOSIS — N3001 Acute cystitis with hematuria: Secondary | ICD-10-CM | POA: Diagnosis not present

## 2015-01-21 DIAGNOSIS — G4700 Insomnia, unspecified: Secondary | ICD-10-CM | POA: Diagnosis not present

## 2015-01-21 DIAGNOSIS — I10 Essential (primary) hypertension: Secondary | ICD-10-CM | POA: Diagnosis not present

## 2015-01-21 DIAGNOSIS — R2689 Other abnormalities of gait and mobility: Secondary | ICD-10-CM | POA: Diagnosis not present

## 2015-01-21 DIAGNOSIS — E1165 Type 2 diabetes mellitus with hyperglycemia: Secondary | ICD-10-CM | POA: Diagnosis not present

## 2015-01-21 DIAGNOSIS — R338 Other retention of urine: Secondary | ICD-10-CM | POA: Diagnosis not present

## 2015-01-22 DIAGNOSIS — R2681 Unsteadiness on feet: Secondary | ICD-10-CM | POA: Diagnosis not present

## 2015-01-22 DIAGNOSIS — J40 Bronchitis, not specified as acute or chronic: Secondary | ICD-10-CM | POA: Diagnosis not present

## 2015-01-22 DIAGNOSIS — R278 Other lack of coordination: Secondary | ICD-10-CM | POA: Diagnosis not present

## 2015-01-22 DIAGNOSIS — G4700 Insomnia, unspecified: Secondary | ICD-10-CM | POA: Diagnosis not present

## 2015-01-22 DIAGNOSIS — R338 Other retention of urine: Secondary | ICD-10-CM | POA: Diagnosis not present

## 2015-01-22 DIAGNOSIS — M6281 Muscle weakness (generalized): Secondary | ICD-10-CM | POA: Diagnosis not present

## 2015-01-22 DIAGNOSIS — N401 Enlarged prostate with lower urinary tract symptoms: Secondary | ICD-10-CM | POA: Diagnosis not present

## 2015-01-22 DIAGNOSIS — N3001 Acute cystitis with hematuria: Secondary | ICD-10-CM | POA: Diagnosis not present

## 2015-01-22 DIAGNOSIS — R5383 Other fatigue: Secondary | ICD-10-CM | POA: Diagnosis not present

## 2015-01-22 DIAGNOSIS — E1165 Type 2 diabetes mellitus with hyperglycemia: Secondary | ICD-10-CM | POA: Diagnosis not present

## 2015-01-22 DIAGNOSIS — R2689 Other abnormalities of gait and mobility: Secondary | ICD-10-CM | POA: Diagnosis not present

## 2015-01-22 DIAGNOSIS — R488 Other symbolic dysfunctions: Secondary | ICD-10-CM | POA: Diagnosis not present

## 2015-01-22 DIAGNOSIS — I10 Essential (primary) hypertension: Secondary | ICD-10-CM | POA: Diagnosis not present

## 2015-01-22 DIAGNOSIS — R54 Age-related physical debility: Secondary | ICD-10-CM | POA: Diagnosis not present

## 2015-01-23 DIAGNOSIS — E1165 Type 2 diabetes mellitus with hyperglycemia: Secondary | ICD-10-CM | POA: Diagnosis not present

## 2015-01-23 DIAGNOSIS — R5383 Other fatigue: Secondary | ICD-10-CM | POA: Diagnosis not present

## 2015-01-23 DIAGNOSIS — R488 Other symbolic dysfunctions: Secondary | ICD-10-CM | POA: Diagnosis not present

## 2015-01-23 DIAGNOSIS — M6281 Muscle weakness (generalized): Secondary | ICD-10-CM | POA: Diagnosis not present

## 2015-01-23 DIAGNOSIS — R278 Other lack of coordination: Secondary | ICD-10-CM | POA: Diagnosis not present

## 2015-01-23 DIAGNOSIS — N3001 Acute cystitis with hematuria: Secondary | ICD-10-CM | POA: Diagnosis not present

## 2015-01-23 DIAGNOSIS — N401 Enlarged prostate with lower urinary tract symptoms: Secondary | ICD-10-CM | POA: Diagnosis not present

## 2015-01-23 DIAGNOSIS — R2689 Other abnormalities of gait and mobility: Secondary | ICD-10-CM | POA: Diagnosis not present

## 2015-01-23 DIAGNOSIS — R54 Age-related physical debility: Secondary | ICD-10-CM | POA: Diagnosis not present

## 2015-01-23 DIAGNOSIS — J40 Bronchitis, not specified as acute or chronic: Secondary | ICD-10-CM | POA: Diagnosis not present

## 2015-01-23 DIAGNOSIS — R338 Other retention of urine: Secondary | ICD-10-CM | POA: Diagnosis not present

## 2015-01-23 DIAGNOSIS — I10 Essential (primary) hypertension: Secondary | ICD-10-CM | POA: Diagnosis not present

## 2015-01-23 DIAGNOSIS — R2681 Unsteadiness on feet: Secondary | ICD-10-CM | POA: Diagnosis not present

## 2015-01-23 DIAGNOSIS — G4700 Insomnia, unspecified: Secondary | ICD-10-CM | POA: Diagnosis not present

## 2015-01-24 DIAGNOSIS — H578 Other specified disorders of eye and adnexa: Secondary | ICD-10-CM | POA: Diagnosis not present

## 2015-01-24 DIAGNOSIS — R54 Age-related physical debility: Secondary | ICD-10-CM | POA: Diagnosis not present

## 2015-01-24 DIAGNOSIS — R488 Other symbolic dysfunctions: Secondary | ICD-10-CM | POA: Diagnosis not present

## 2015-01-24 DIAGNOSIS — N401 Enlarged prostate with lower urinary tract symptoms: Secondary | ICD-10-CM | POA: Diagnosis not present

## 2015-01-24 DIAGNOSIS — G4700 Insomnia, unspecified: Secondary | ICD-10-CM | POA: Diagnosis not present

## 2015-01-24 DIAGNOSIS — E1165 Type 2 diabetes mellitus with hyperglycemia: Secondary | ICD-10-CM | POA: Diagnosis not present

## 2015-01-24 DIAGNOSIS — R2689 Other abnormalities of gait and mobility: Secondary | ICD-10-CM | POA: Diagnosis not present

## 2015-01-24 DIAGNOSIS — N3001 Acute cystitis with hematuria: Secondary | ICD-10-CM | POA: Diagnosis not present

## 2015-01-24 DIAGNOSIS — R338 Other retention of urine: Secondary | ICD-10-CM | POA: Diagnosis not present

## 2015-01-24 DIAGNOSIS — R2681 Unsteadiness on feet: Secondary | ICD-10-CM | POA: Diagnosis not present

## 2015-01-24 DIAGNOSIS — I10 Essential (primary) hypertension: Secondary | ICD-10-CM | POA: Diagnosis not present

## 2015-01-24 DIAGNOSIS — J40 Bronchitis, not specified as acute or chronic: Secondary | ICD-10-CM | POA: Diagnosis not present

## 2015-01-24 DIAGNOSIS — R278 Other lack of coordination: Secondary | ICD-10-CM | POA: Diagnosis not present

## 2015-01-24 DIAGNOSIS — T22131S Burn of first degree of right upper arm, sequela: Secondary | ICD-10-CM | POA: Diagnosis not present

## 2015-01-24 DIAGNOSIS — M6281 Muscle weakness (generalized): Secondary | ICD-10-CM | POA: Diagnosis not present

## 2015-01-24 DIAGNOSIS — L550 Sunburn of first degree: Secondary | ICD-10-CM | POA: Diagnosis not present

## 2015-01-24 DIAGNOSIS — R5383 Other fatigue: Secondary | ICD-10-CM | POA: Diagnosis not present

## 2015-01-27 DIAGNOSIS — R488 Other symbolic dysfunctions: Secondary | ICD-10-CM | POA: Diagnosis not present

## 2015-01-27 DIAGNOSIS — R5383 Other fatigue: Secondary | ICD-10-CM | POA: Diagnosis not present

## 2015-01-27 DIAGNOSIS — N3001 Acute cystitis with hematuria: Secondary | ICD-10-CM | POA: Diagnosis not present

## 2015-01-27 DIAGNOSIS — M6281 Muscle weakness (generalized): Secondary | ICD-10-CM | POA: Diagnosis not present

## 2015-01-27 DIAGNOSIS — R2681 Unsteadiness on feet: Secondary | ICD-10-CM | POA: Diagnosis not present

## 2015-01-27 DIAGNOSIS — E1165 Type 2 diabetes mellitus with hyperglycemia: Secondary | ICD-10-CM | POA: Diagnosis not present

## 2015-01-27 DIAGNOSIS — R338 Other retention of urine: Secondary | ICD-10-CM | POA: Diagnosis not present

## 2015-01-27 DIAGNOSIS — R54 Age-related physical debility: Secondary | ICD-10-CM | POA: Diagnosis not present

## 2015-01-27 DIAGNOSIS — R278 Other lack of coordination: Secondary | ICD-10-CM | POA: Diagnosis not present

## 2015-01-27 DIAGNOSIS — J40 Bronchitis, not specified as acute or chronic: Secondary | ICD-10-CM | POA: Diagnosis not present

## 2015-01-27 DIAGNOSIS — G4700 Insomnia, unspecified: Secondary | ICD-10-CM | POA: Diagnosis not present

## 2015-01-27 DIAGNOSIS — N401 Enlarged prostate with lower urinary tract symptoms: Secondary | ICD-10-CM | POA: Diagnosis not present

## 2015-01-27 DIAGNOSIS — R2689 Other abnormalities of gait and mobility: Secondary | ICD-10-CM | POA: Diagnosis not present

## 2015-01-27 DIAGNOSIS — I10 Essential (primary) hypertension: Secondary | ICD-10-CM | POA: Diagnosis not present

## 2015-01-28 DIAGNOSIS — R278 Other lack of coordination: Secondary | ICD-10-CM | POA: Diagnosis not present

## 2015-01-28 DIAGNOSIS — R2689 Other abnormalities of gait and mobility: Secondary | ICD-10-CM | POA: Diagnosis not present

## 2015-01-28 DIAGNOSIS — R54 Age-related physical debility: Secondary | ICD-10-CM | POA: Diagnosis not present

## 2015-01-28 DIAGNOSIS — M6281 Muscle weakness (generalized): Secondary | ICD-10-CM | POA: Diagnosis not present

## 2015-01-28 DIAGNOSIS — J40 Bronchitis, not specified as acute or chronic: Secondary | ICD-10-CM | POA: Diagnosis not present

## 2015-01-28 DIAGNOSIS — E1165 Type 2 diabetes mellitus with hyperglycemia: Secondary | ICD-10-CM | POA: Diagnosis not present

## 2015-01-28 DIAGNOSIS — R338 Other retention of urine: Secondary | ICD-10-CM | POA: Diagnosis not present

## 2015-01-28 DIAGNOSIS — N3001 Acute cystitis with hematuria: Secondary | ICD-10-CM | POA: Diagnosis not present

## 2015-01-28 DIAGNOSIS — R5383 Other fatigue: Secondary | ICD-10-CM | POA: Diagnosis not present

## 2015-01-28 DIAGNOSIS — N401 Enlarged prostate with lower urinary tract symptoms: Secondary | ICD-10-CM | POA: Diagnosis not present

## 2015-01-28 DIAGNOSIS — R2681 Unsteadiness on feet: Secondary | ICD-10-CM | POA: Diagnosis not present

## 2015-01-28 DIAGNOSIS — R488 Other symbolic dysfunctions: Secondary | ICD-10-CM | POA: Diagnosis not present

## 2015-01-28 DIAGNOSIS — G4700 Insomnia, unspecified: Secondary | ICD-10-CM | POA: Diagnosis not present

## 2015-01-28 DIAGNOSIS — I10 Essential (primary) hypertension: Secondary | ICD-10-CM | POA: Diagnosis not present

## 2015-01-29 DIAGNOSIS — F0390 Unspecified dementia without behavioral disturbance: Secondary | ICD-10-CM | POA: Diagnosis not present

## 2015-01-29 DIAGNOSIS — Z1389 Encounter for screening for other disorder: Secondary | ICD-10-CM | POA: Diagnosis not present

## 2015-01-29 DIAGNOSIS — E119 Type 2 diabetes mellitus without complications: Secondary | ICD-10-CM | POA: Diagnosis not present

## 2015-01-29 DIAGNOSIS — N400 Enlarged prostate without lower urinary tract symptoms: Secondary | ICD-10-CM | POA: Diagnosis not present

## 2015-01-29 DIAGNOSIS — Z0000 Encounter for general adult medical examination without abnormal findings: Secondary | ICD-10-CM | POA: Diagnosis not present

## 2015-01-29 DIAGNOSIS — Z139 Encounter for screening, unspecified: Secondary | ICD-10-CM | POA: Diagnosis not present

## 2015-01-29 DIAGNOSIS — I10 Essential (primary) hypertension: Secondary | ICD-10-CM | POA: Diagnosis not present

## 2015-03-02 DIAGNOSIS — R4182 Altered mental status, unspecified: Secondary | ICD-10-CM | POA: Diagnosis not present

## 2015-03-02 DIAGNOSIS — N390 Urinary tract infection, site not specified: Secondary | ICD-10-CM | POA: Diagnosis not present

## 2015-03-07 DIAGNOSIS — E119 Type 2 diabetes mellitus without complications: Secondary | ICD-10-CM | POA: Diagnosis not present

## 2015-03-07 DIAGNOSIS — B965 Pseudomonas (aeruginosa) (mallei) (pseudomallei) as the cause of diseases classified elsewhere: Secondary | ICD-10-CM | POA: Diagnosis not present

## 2015-03-07 DIAGNOSIS — M1990 Unspecified osteoarthritis, unspecified site: Secondary | ICD-10-CM | POA: Diagnosis not present

## 2015-03-07 DIAGNOSIS — N401 Enlarged prostate with lower urinary tract symptoms: Secondary | ICD-10-CM | POA: Diagnosis not present

## 2015-03-07 DIAGNOSIS — N3289 Other specified disorders of bladder: Secondary | ICD-10-CM | POA: Diagnosis not present

## 2015-03-07 DIAGNOSIS — G309 Alzheimer's disease, unspecified: Secondary | ICD-10-CM | POA: Diagnosis not present

## 2015-03-07 DIAGNOSIS — Z87440 Personal history of urinary (tract) infections: Secondary | ICD-10-CM | POA: Diagnosis not present

## 2015-03-07 DIAGNOSIS — N3001 Acute cystitis with hematuria: Secondary | ICD-10-CM | POA: Diagnosis not present

## 2015-03-07 DIAGNOSIS — Z7982 Long term (current) use of aspirin: Secondary | ICD-10-CM | POA: Diagnosis not present

## 2015-03-07 DIAGNOSIS — T8189XA Other complications of procedures, not elsewhere classified, initial encounter: Secondary | ICD-10-CM | POA: Diagnosis not present

## 2015-03-07 DIAGNOSIS — Z87891 Personal history of nicotine dependence: Secondary | ICD-10-CM | POA: Diagnosis not present

## 2015-03-07 DIAGNOSIS — E871 Hypo-osmolality and hyponatremia: Secondary | ICD-10-CM | POA: Diagnosis not present

## 2015-03-07 DIAGNOSIS — N390 Urinary tract infection, site not specified: Secondary | ICD-10-CM | POA: Diagnosis not present

## 2015-03-07 DIAGNOSIS — N400 Enlarged prostate without lower urinary tract symptoms: Secondary | ICD-10-CM | POA: Diagnosis not present

## 2015-03-07 DIAGNOSIS — Z794 Long term (current) use of insulin: Secondary | ICD-10-CM | POA: Diagnosis not present

## 2015-03-07 DIAGNOSIS — R5383 Other fatigue: Secondary | ICD-10-CM | POA: Diagnosis not present

## 2015-03-07 DIAGNOSIS — Z0389 Encounter for observation for other suspected diseases and conditions ruled out: Secondary | ICD-10-CM | POA: Diagnosis not present

## 2015-03-07 DIAGNOSIS — I10 Essential (primary) hypertension: Secondary | ICD-10-CM | POA: Diagnosis not present

## 2015-03-07 DIAGNOSIS — E860 Dehydration: Secondary | ICD-10-CM | POA: Diagnosis not present

## 2015-03-07 DIAGNOSIS — R2689 Other abnormalities of gait and mobility: Secondary | ICD-10-CM | POA: Diagnosis not present

## 2015-03-07 DIAGNOSIS — Z79899 Other long term (current) drug therapy: Secondary | ICD-10-CM | POA: Diagnosis not present

## 2015-03-07 DIAGNOSIS — N320 Bladder-neck obstruction: Secondary | ICD-10-CM | POA: Diagnosis not present

## 2015-03-07 DIAGNOSIS — M6281 Muscle weakness (generalized): Secondary | ICD-10-CM | POA: Diagnosis not present

## 2015-03-07 DIAGNOSIS — F0390 Unspecified dementia without behavioral disturbance: Secondary | ICD-10-CM | POA: Diagnosis not present

## 2015-03-07 DIAGNOSIS — N411 Chronic prostatitis: Secondary | ICD-10-CM | POA: Diagnosis not present

## 2015-03-07 DIAGNOSIS — R279 Unspecified lack of coordination: Secondary | ICD-10-CM | POA: Diagnosis not present

## 2015-03-07 DIAGNOSIS — E1165 Type 2 diabetes mellitus with hyperglycemia: Secondary | ICD-10-CM | POA: Diagnosis not present

## 2015-03-07 DIAGNOSIS — D72823 Leukemoid reaction: Secondary | ICD-10-CM | POA: Diagnosis not present

## 2015-03-07 DIAGNOSIS — T83198A Other mechanical complication of other urinary devices and implants, initial encounter: Secondary | ICD-10-CM | POA: Diagnosis not present

## 2015-03-07 DIAGNOSIS — D72829 Elevated white blood cell count, unspecified: Secondary | ICD-10-CM | POA: Diagnosis not present

## 2015-03-07 DIAGNOSIS — N138 Other obstructive and reflux uropathy: Secondary | ICD-10-CM | POA: Diagnosis not present

## 2015-03-07 DIAGNOSIS — E780 Pure hypercholesterolemia: Secondary | ICD-10-CM | POA: Diagnosis not present

## 2015-03-07 DIAGNOSIS — R338 Other retention of urine: Secondary | ICD-10-CM | POA: Diagnosis not present

## 2015-03-07 DIAGNOSIS — Z7401 Bed confinement status: Secondary | ICD-10-CM | POA: Diagnosis not present

## 2015-03-07 DIAGNOSIS — R9431 Abnormal electrocardiogram [ECG] [EKG]: Secondary | ICD-10-CM | POA: Diagnosis not present

## 2015-03-08 DIAGNOSIS — Z87891 Personal history of nicotine dependence: Secondary | ICD-10-CM | POA: Diagnosis not present

## 2015-03-08 DIAGNOSIS — Z87440 Personal history of urinary (tract) infections: Secondary | ICD-10-CM | POA: Diagnosis not present

## 2015-03-08 DIAGNOSIS — I10 Essential (primary) hypertension: Secondary | ICD-10-CM | POA: Diagnosis not present

## 2015-03-08 DIAGNOSIS — B965 Pseudomonas (aeruginosa) (mallei) (pseudomallei) as the cause of diseases classified elsewhere: Secondary | ICD-10-CM | POA: Diagnosis not present

## 2015-03-08 DIAGNOSIS — N3001 Acute cystitis with hematuria: Secondary | ICD-10-CM | POA: Diagnosis not present

## 2015-03-08 DIAGNOSIS — Z7982 Long term (current) use of aspirin: Secondary | ICD-10-CM | POA: Diagnosis not present

## 2015-03-08 DIAGNOSIS — E780 Pure hypercholesterolemia: Secondary | ICD-10-CM | POA: Diagnosis not present

## 2015-03-08 DIAGNOSIS — R338 Other retention of urine: Secondary | ICD-10-CM | POA: Diagnosis not present

## 2015-03-08 DIAGNOSIS — M1990 Unspecified osteoarthritis, unspecified site: Secondary | ICD-10-CM | POA: Diagnosis not present

## 2015-03-08 DIAGNOSIS — F0390 Unspecified dementia without behavioral disturbance: Secondary | ICD-10-CM | POA: Diagnosis not present

## 2015-03-08 DIAGNOSIS — E1165 Type 2 diabetes mellitus with hyperglycemia: Secondary | ICD-10-CM | POA: Diagnosis not present

## 2015-03-08 DIAGNOSIS — E871 Hypo-osmolality and hyponatremia: Secondary | ICD-10-CM | POA: Diagnosis not present

## 2015-03-08 DIAGNOSIS — E860 Dehydration: Secondary | ICD-10-CM | POA: Diagnosis not present

## 2015-03-08 DIAGNOSIS — D72823 Leukemoid reaction: Secondary | ICD-10-CM | POA: Diagnosis not present

## 2015-03-08 DIAGNOSIS — Z79899 Other long term (current) drug therapy: Secondary | ICD-10-CM | POA: Diagnosis not present

## 2015-03-08 DIAGNOSIS — N401 Enlarged prostate with lower urinary tract symptoms: Secondary | ICD-10-CM | POA: Diagnosis not present

## 2015-03-08 DIAGNOSIS — Z794 Long term (current) use of insulin: Secondary | ICD-10-CM | POA: Diagnosis not present

## 2015-03-09 DIAGNOSIS — Z79899 Other long term (current) drug therapy: Secondary | ICD-10-CM | POA: Diagnosis not present

## 2015-03-09 DIAGNOSIS — E871 Hypo-osmolality and hyponatremia: Secondary | ICD-10-CM | POA: Diagnosis not present

## 2015-03-09 DIAGNOSIS — M1990 Unspecified osteoarthritis, unspecified site: Secondary | ICD-10-CM | POA: Diagnosis not present

## 2015-03-09 DIAGNOSIS — E1165 Type 2 diabetes mellitus with hyperglycemia: Secondary | ICD-10-CM | POA: Diagnosis not present

## 2015-03-09 DIAGNOSIS — D72823 Leukemoid reaction: Secondary | ICD-10-CM | POA: Diagnosis not present

## 2015-03-09 DIAGNOSIS — E860 Dehydration: Secondary | ICD-10-CM | POA: Diagnosis not present

## 2015-03-09 DIAGNOSIS — Z794 Long term (current) use of insulin: Secondary | ICD-10-CM | POA: Diagnosis not present

## 2015-03-09 DIAGNOSIS — E780 Pure hypercholesterolemia: Secondary | ICD-10-CM | POA: Diagnosis not present

## 2015-03-09 DIAGNOSIS — N401 Enlarged prostate with lower urinary tract symptoms: Secondary | ICD-10-CM | POA: Diagnosis not present

## 2015-03-09 DIAGNOSIS — B965 Pseudomonas (aeruginosa) (mallei) (pseudomallei) as the cause of diseases classified elsewhere: Secondary | ICD-10-CM | POA: Diagnosis not present

## 2015-03-09 DIAGNOSIS — Z87891 Personal history of nicotine dependence: Secondary | ICD-10-CM | POA: Diagnosis not present

## 2015-03-09 DIAGNOSIS — N3001 Acute cystitis with hematuria: Secondary | ICD-10-CM | POA: Diagnosis not present

## 2015-03-09 DIAGNOSIS — I10 Essential (primary) hypertension: Secondary | ICD-10-CM | POA: Diagnosis not present

## 2015-03-09 DIAGNOSIS — Z87440 Personal history of urinary (tract) infections: Secondary | ICD-10-CM | POA: Diagnosis not present

## 2015-03-09 DIAGNOSIS — R338 Other retention of urine: Secondary | ICD-10-CM | POA: Diagnosis not present

## 2015-03-09 DIAGNOSIS — Z7982 Long term (current) use of aspirin: Secondary | ICD-10-CM | POA: Diagnosis not present

## 2015-03-09 DIAGNOSIS — F0390 Unspecified dementia without behavioral disturbance: Secondary | ICD-10-CM | POA: Diagnosis not present

## 2015-03-10 DIAGNOSIS — E860 Dehydration: Secondary | ICD-10-CM | POA: Diagnosis not present

## 2015-03-10 DIAGNOSIS — N401 Enlarged prostate with lower urinary tract symptoms: Secondary | ICD-10-CM | POA: Diagnosis not present

## 2015-03-10 DIAGNOSIS — D72823 Leukemoid reaction: Secondary | ICD-10-CM | POA: Diagnosis not present

## 2015-03-10 DIAGNOSIS — B965 Pseudomonas (aeruginosa) (mallei) (pseudomallei) as the cause of diseases classified elsewhere: Secondary | ICD-10-CM | POA: Diagnosis not present

## 2015-03-10 DIAGNOSIS — N3001 Acute cystitis with hematuria: Secondary | ICD-10-CM | POA: Diagnosis not present

## 2015-03-10 DIAGNOSIS — F0390 Unspecified dementia without behavioral disturbance: Secondary | ICD-10-CM | POA: Diagnosis not present

## 2015-03-10 DIAGNOSIS — E1165 Type 2 diabetes mellitus with hyperglycemia: Secondary | ICD-10-CM | POA: Diagnosis not present

## 2015-03-10 DIAGNOSIS — Z87440 Personal history of urinary (tract) infections: Secondary | ICD-10-CM | POA: Diagnosis not present

## 2015-03-10 DIAGNOSIS — R338 Other retention of urine: Secondary | ICD-10-CM | POA: Diagnosis not present

## 2015-03-10 DIAGNOSIS — Z794 Long term (current) use of insulin: Secondary | ICD-10-CM | POA: Diagnosis not present

## 2015-03-10 DIAGNOSIS — Z79899 Other long term (current) drug therapy: Secondary | ICD-10-CM | POA: Diagnosis not present

## 2015-03-10 DIAGNOSIS — I10 Essential (primary) hypertension: Secondary | ICD-10-CM | POA: Diagnosis not present

## 2015-03-10 DIAGNOSIS — M1990 Unspecified osteoarthritis, unspecified site: Secondary | ICD-10-CM | POA: Diagnosis not present

## 2015-03-10 DIAGNOSIS — Z87891 Personal history of nicotine dependence: Secondary | ICD-10-CM | POA: Diagnosis not present

## 2015-03-10 DIAGNOSIS — Z7982 Long term (current) use of aspirin: Secondary | ICD-10-CM | POA: Diagnosis not present

## 2015-03-10 DIAGNOSIS — E871 Hypo-osmolality and hyponatremia: Secondary | ICD-10-CM | POA: Diagnosis not present

## 2015-03-10 DIAGNOSIS — E780 Pure hypercholesterolemia: Secondary | ICD-10-CM | POA: Diagnosis not present

## 2015-03-11 DIAGNOSIS — N3001 Acute cystitis with hematuria: Secondary | ICD-10-CM | POA: Diagnosis not present

## 2015-03-11 DIAGNOSIS — N401 Enlarged prostate with lower urinary tract symptoms: Secondary | ICD-10-CM | POA: Diagnosis not present

## 2015-03-11 DIAGNOSIS — Z87891 Personal history of nicotine dependence: Secondary | ICD-10-CM | POA: Diagnosis not present

## 2015-03-11 DIAGNOSIS — E1165 Type 2 diabetes mellitus with hyperglycemia: Secondary | ICD-10-CM | POA: Diagnosis not present

## 2015-03-11 DIAGNOSIS — M1990 Unspecified osteoarthritis, unspecified site: Secondary | ICD-10-CM | POA: Diagnosis not present

## 2015-03-11 DIAGNOSIS — F0390 Unspecified dementia without behavioral disturbance: Secondary | ICD-10-CM | POA: Diagnosis not present

## 2015-03-11 DIAGNOSIS — Z794 Long term (current) use of insulin: Secondary | ICD-10-CM | POA: Diagnosis not present

## 2015-03-11 DIAGNOSIS — R338 Other retention of urine: Secondary | ICD-10-CM | POA: Diagnosis not present

## 2015-03-11 DIAGNOSIS — E860 Dehydration: Secondary | ICD-10-CM | POA: Diagnosis not present

## 2015-03-11 DIAGNOSIS — Z87440 Personal history of urinary (tract) infections: Secondary | ICD-10-CM | POA: Diagnosis not present

## 2015-03-11 DIAGNOSIS — E780 Pure hypercholesterolemia: Secondary | ICD-10-CM | POA: Diagnosis not present

## 2015-03-11 DIAGNOSIS — Z79899 Other long term (current) drug therapy: Secondary | ICD-10-CM | POA: Diagnosis not present

## 2015-03-11 DIAGNOSIS — B965 Pseudomonas (aeruginosa) (mallei) (pseudomallei) as the cause of diseases classified elsewhere: Secondary | ICD-10-CM | POA: Diagnosis not present

## 2015-03-11 DIAGNOSIS — E871 Hypo-osmolality and hyponatremia: Secondary | ICD-10-CM | POA: Diagnosis not present

## 2015-03-11 DIAGNOSIS — I10 Essential (primary) hypertension: Secondary | ICD-10-CM | POA: Diagnosis not present

## 2015-03-11 DIAGNOSIS — Z7982 Long term (current) use of aspirin: Secondary | ICD-10-CM | POA: Diagnosis not present

## 2015-03-11 DIAGNOSIS — D72823 Leukemoid reaction: Secondary | ICD-10-CM | POA: Diagnosis not present

## 2015-03-12 DIAGNOSIS — R5383 Other fatigue: Secondary | ICD-10-CM | POA: Diagnosis not present

## 2015-03-12 DIAGNOSIS — M6281 Muscle weakness (generalized): Secondary | ICD-10-CM | POA: Diagnosis not present

## 2015-03-12 DIAGNOSIS — B965 Pseudomonas (aeruginosa) (mallei) (pseudomallei) as the cause of diseases classified elsewhere: Secondary | ICD-10-CM | POA: Diagnosis not present

## 2015-03-12 DIAGNOSIS — D72823 Leukemoid reaction: Secondary | ICD-10-CM | POA: Diagnosis not present

## 2015-03-12 DIAGNOSIS — E1165 Type 2 diabetes mellitus with hyperglycemia: Secondary | ICD-10-CM | POA: Diagnosis not present

## 2015-03-12 DIAGNOSIS — N3001 Acute cystitis with hematuria: Secondary | ICD-10-CM | POA: Diagnosis not present

## 2015-03-12 DIAGNOSIS — E860 Dehydration: Secondary | ICD-10-CM | POA: Diagnosis not present

## 2015-03-12 DIAGNOSIS — I10 Essential (primary) hypertension: Secondary | ICD-10-CM | POA: Diagnosis not present

## 2015-03-12 DIAGNOSIS — R279 Unspecified lack of coordination: Secondary | ICD-10-CM | POA: Diagnosis not present

## 2015-03-12 DIAGNOSIS — Z79899 Other long term (current) drug therapy: Secondary | ICD-10-CM | POA: Diagnosis not present

## 2015-03-12 DIAGNOSIS — R339 Retention of urine, unspecified: Secondary | ICD-10-CM | POA: Diagnosis not present

## 2015-03-12 DIAGNOSIS — Z7401 Bed confinement status: Secondary | ICD-10-CM | POA: Diagnosis not present

## 2015-03-12 DIAGNOSIS — R338 Other retention of urine: Secondary | ICD-10-CM | POA: Diagnosis not present

## 2015-03-12 DIAGNOSIS — R2689 Other abnormalities of gait and mobility: Secondary | ICD-10-CM | POA: Diagnosis not present

## 2015-03-12 DIAGNOSIS — N3090 Cystitis, unspecified without hematuria: Secondary | ICD-10-CM | POA: Diagnosis not present

## 2015-03-12 DIAGNOSIS — Z794 Long term (current) use of insulin: Secondary | ICD-10-CM | POA: Diagnosis not present

## 2015-03-12 DIAGNOSIS — M1990 Unspecified osteoarthritis, unspecified site: Secondary | ICD-10-CM | POA: Diagnosis not present

## 2015-03-12 DIAGNOSIS — Z7982 Long term (current) use of aspirin: Secondary | ICD-10-CM | POA: Diagnosis not present

## 2015-03-12 DIAGNOSIS — N312 Flaccid neuropathic bladder, not elsewhere classified: Secondary | ICD-10-CM | POA: Diagnosis not present

## 2015-03-12 DIAGNOSIS — N390 Urinary tract infection, site not specified: Secondary | ICD-10-CM | POA: Diagnosis not present

## 2015-03-12 DIAGNOSIS — D72829 Elevated white blood cell count, unspecified: Secondary | ICD-10-CM | POA: Diagnosis not present

## 2015-03-12 DIAGNOSIS — F0390 Unspecified dementia without behavioral disturbance: Secondary | ICD-10-CM | POA: Diagnosis not present

## 2015-03-12 DIAGNOSIS — Z87440 Personal history of urinary (tract) infections: Secondary | ICD-10-CM | POA: Diagnosis not present

## 2015-03-12 DIAGNOSIS — N401 Enlarged prostate with lower urinary tract symptoms: Secondary | ICD-10-CM | POA: Diagnosis not present

## 2015-03-12 DIAGNOSIS — E871 Hypo-osmolality and hyponatremia: Secondary | ICD-10-CM | POA: Diagnosis not present

## 2015-03-12 DIAGNOSIS — Z87891 Personal history of nicotine dependence: Secondary | ICD-10-CM | POA: Diagnosis not present

## 2015-03-12 DIAGNOSIS — E780 Pure hypercholesterolemia: Secondary | ICD-10-CM | POA: Diagnosis not present

## 2015-03-14 DIAGNOSIS — N3090 Cystitis, unspecified without hematuria: Secondary | ICD-10-CM | POA: Diagnosis not present

## 2015-03-25 DIAGNOSIS — N3090 Cystitis, unspecified without hematuria: Secondary | ICD-10-CM | POA: Diagnosis not present

## 2015-04-04 DIAGNOSIS — R339 Retention of urine, unspecified: Secondary | ICD-10-CM | POA: Diagnosis not present

## 2015-04-04 DIAGNOSIS — N3090 Cystitis, unspecified without hematuria: Secondary | ICD-10-CM | POA: Diagnosis not present

## 2015-04-04 DIAGNOSIS — N312 Flaccid neuropathic bladder, not elsewhere classified: Secondary | ICD-10-CM | POA: Diagnosis not present

## 2015-04-04 DIAGNOSIS — N390 Urinary tract infection, site not specified: Secondary | ICD-10-CM | POA: Diagnosis not present

## 2015-04-10 DIAGNOSIS — N390 Urinary tract infection, site not specified: Secondary | ICD-10-CM | POA: Diagnosis not present

## 2015-04-11 DIAGNOSIS — N390 Urinary tract infection, site not specified: Secondary | ICD-10-CM | POA: Diagnosis not present

## 2015-04-11 DIAGNOSIS — Z79899 Other long term (current) drug therapy: Secondary | ICD-10-CM | POA: Diagnosis not present

## 2015-04-18 DIAGNOSIS — N3090 Cystitis, unspecified without hematuria: Secondary | ICD-10-CM | POA: Diagnosis not present

## 2015-04-18 DIAGNOSIS — R3912 Poor urinary stream: Secondary | ICD-10-CM | POA: Diagnosis not present

## 2015-04-18 DIAGNOSIS — N312 Flaccid neuropathic bladder, not elsewhere classified: Secondary | ICD-10-CM | POA: Diagnosis not present

## 2015-04-18 DIAGNOSIS — R339 Retention of urine, unspecified: Secondary | ICD-10-CM | POA: Diagnosis not present

## 2015-04-19 DIAGNOSIS — Z79899 Other long term (current) drug therapy: Secondary | ICD-10-CM | POA: Diagnosis not present

## 2015-04-24 DIAGNOSIS — N390 Urinary tract infection, site not specified: Secondary | ICD-10-CM | POA: Diagnosis not present

## 2015-05-06 DIAGNOSIS — N312 Flaccid neuropathic bladder, not elsewhere classified: Secondary | ICD-10-CM | POA: Diagnosis not present

## 2015-05-06 DIAGNOSIS — I10 Essential (primary) hypertension: Secondary | ICD-10-CM | POA: Diagnosis not present

## 2015-05-06 DIAGNOSIS — N3020 Other chronic cystitis without hematuria: Secondary | ICD-10-CM | POA: Diagnosis not present

## 2015-05-06 DIAGNOSIS — E1165 Type 2 diabetes mellitus with hyperglycemia: Secondary | ICD-10-CM | POA: Diagnosis not present

## 2015-05-06 DIAGNOSIS — N3001 Acute cystitis with hematuria: Secondary | ICD-10-CM | POA: Diagnosis not present

## 2015-05-06 DIAGNOSIS — N3090 Cystitis, unspecified without hematuria: Secondary | ICD-10-CM | POA: Diagnosis not present

## 2015-05-06 DIAGNOSIS — N318 Other neuromuscular dysfunction of bladder: Secondary | ICD-10-CM | POA: Diagnosis not present

## 2015-05-06 DIAGNOSIS — R339 Retention of urine, unspecified: Secondary | ICD-10-CM | POA: Diagnosis not present

## 2015-05-06 DIAGNOSIS — E109 Type 1 diabetes mellitus without complications: Secondary | ICD-10-CM | POA: Diagnosis not present

## 2015-05-24 DIAGNOSIS — G309 Alzheimer's disease, unspecified: Secondary | ICD-10-CM | POA: Diagnosis not present

## 2015-05-24 DIAGNOSIS — R339 Retention of urine, unspecified: Secondary | ICD-10-CM | POA: Diagnosis not present

## 2015-05-24 DIAGNOSIS — N400 Enlarged prostate without lower urinary tract symptoms: Secondary | ICD-10-CM | POA: Diagnosis not present

## 2015-05-24 DIAGNOSIS — E119 Type 2 diabetes mellitus without complications: Secondary | ICD-10-CM | POA: Diagnosis not present

## 2015-05-26 DIAGNOSIS — N390 Urinary tract infection, site not specified: Secondary | ICD-10-CM | POA: Diagnosis not present

## 2015-05-30 DIAGNOSIS — N390 Urinary tract infection, site not specified: Secondary | ICD-10-CM | POA: Diagnosis not present

## 2015-06-09 DIAGNOSIS — N318 Other neuromuscular dysfunction of bladder: Secondary | ICD-10-CM | POA: Diagnosis not present

## 2015-06-09 DIAGNOSIS — R339 Retention of urine, unspecified: Secondary | ICD-10-CM | POA: Diagnosis not present

## 2015-06-09 DIAGNOSIS — N3020 Other chronic cystitis without hematuria: Secondary | ICD-10-CM | POA: Diagnosis not present

## 2015-06-09 DIAGNOSIS — E109 Type 1 diabetes mellitus without complications: Secondary | ICD-10-CM | POA: Diagnosis not present

## 2015-06-09 DIAGNOSIS — N312 Flaccid neuropathic bladder, not elsewhere classified: Secondary | ICD-10-CM | POA: Diagnosis not present

## 2015-06-25 DIAGNOSIS — N390 Urinary tract infection, site not specified: Secondary | ICD-10-CM | POA: Diagnosis not present

## 2015-06-28 DIAGNOSIS — Z9181 History of falling: Secondary | ICD-10-CM | POA: Diagnosis not present

## 2015-06-28 DIAGNOSIS — G309 Alzheimer's disease, unspecified: Secondary | ICD-10-CM | POA: Diagnosis not present

## 2015-06-28 DIAGNOSIS — Z1389 Encounter for screening for other disorder: Secondary | ICD-10-CM | POA: Diagnosis not present

## 2015-06-28 DIAGNOSIS — I700 Atherosclerosis of aorta: Secondary | ICD-10-CM | POA: Diagnosis not present

## 2015-06-28 DIAGNOSIS — Z139 Encounter for screening, unspecified: Secondary | ICD-10-CM | POA: Diagnosis not present

## 2015-06-28 DIAGNOSIS — N400 Enlarged prostate without lower urinary tract symptoms: Secondary | ICD-10-CM | POA: Diagnosis not present

## 2015-07-17 DIAGNOSIS — R3912 Poor urinary stream: Secondary | ICD-10-CM | POA: Diagnosis not present

## 2015-07-17 DIAGNOSIS — N390 Urinary tract infection, site not specified: Secondary | ICD-10-CM | POA: Diagnosis not present

## 2015-07-17 DIAGNOSIS — N318 Other neuromuscular dysfunction of bladder: Secondary | ICD-10-CM | POA: Diagnosis not present

## 2015-07-17 DIAGNOSIS — R339 Retention of urine, unspecified: Secondary | ICD-10-CM | POA: Diagnosis not present

## 2015-07-17 DIAGNOSIS — N312 Flaccid neuropathic bladder, not elsewhere classified: Secondary | ICD-10-CM | POA: Diagnosis not present

## 2015-07-26 DIAGNOSIS — N390 Urinary tract infection, site not specified: Secondary | ICD-10-CM | POA: Diagnosis not present

## 2015-08-27 DIAGNOSIS — G309 Alzheimer's disease, unspecified: Secondary | ICD-10-CM | POA: Diagnosis not present

## 2015-08-27 DIAGNOSIS — R5383 Other fatigue: Secondary | ICD-10-CM | POA: Diagnosis not present

## 2015-08-27 DIAGNOSIS — N3001 Acute cystitis with hematuria: Secondary | ICD-10-CM | POA: Diagnosis not present

## 2015-08-27 DIAGNOSIS — N401 Enlarged prostate with lower urinary tract symptoms: Secondary | ICD-10-CM | POA: Diagnosis not present

## 2015-08-27 DIAGNOSIS — R2689 Other abnormalities of gait and mobility: Secondary | ICD-10-CM | POA: Diagnosis not present

## 2015-08-27 DIAGNOSIS — D72829 Elevated white blood cell count, unspecified: Secondary | ICD-10-CM | POA: Diagnosis not present

## 2015-08-27 DIAGNOSIS — I10 Essential (primary) hypertension: Secondary | ICD-10-CM | POA: Diagnosis not present

## 2015-08-27 DIAGNOSIS — M6281 Muscle weakness (generalized): Secondary | ICD-10-CM | POA: Diagnosis not present

## 2015-08-27 DIAGNOSIS — E871 Hypo-osmolality and hyponatremia: Secondary | ICD-10-CM | POA: Diagnosis not present

## 2015-08-27 DIAGNOSIS — R338 Other retention of urine: Secondary | ICD-10-CM | POA: Diagnosis not present

## 2015-08-28 DIAGNOSIS — R338 Other retention of urine: Secondary | ICD-10-CM | POA: Diagnosis not present

## 2015-08-28 DIAGNOSIS — M6281 Muscle weakness (generalized): Secondary | ICD-10-CM | POA: Diagnosis not present

## 2015-08-28 DIAGNOSIS — D72829 Elevated white blood cell count, unspecified: Secondary | ICD-10-CM | POA: Diagnosis not present

## 2015-08-28 DIAGNOSIS — R2689 Other abnormalities of gait and mobility: Secondary | ICD-10-CM | POA: Diagnosis not present

## 2015-08-28 DIAGNOSIS — I10 Essential (primary) hypertension: Secondary | ICD-10-CM | POA: Diagnosis not present

## 2015-08-28 DIAGNOSIS — N3001 Acute cystitis with hematuria: Secondary | ICD-10-CM | POA: Diagnosis not present

## 2015-08-28 DIAGNOSIS — N401 Enlarged prostate with lower urinary tract symptoms: Secondary | ICD-10-CM | POA: Diagnosis not present

## 2015-08-28 DIAGNOSIS — E871 Hypo-osmolality and hyponatremia: Secondary | ICD-10-CM | POA: Diagnosis not present

## 2015-08-28 DIAGNOSIS — G309 Alzheimer's disease, unspecified: Secondary | ICD-10-CM | POA: Diagnosis not present

## 2015-08-28 DIAGNOSIS — R5383 Other fatigue: Secondary | ICD-10-CM | POA: Diagnosis not present

## 2015-08-29 DIAGNOSIS — D72829 Elevated white blood cell count, unspecified: Secondary | ICD-10-CM | POA: Diagnosis not present

## 2015-08-29 DIAGNOSIS — R2689 Other abnormalities of gait and mobility: Secondary | ICD-10-CM | POA: Diagnosis not present

## 2015-08-29 DIAGNOSIS — M6281 Muscle weakness (generalized): Secondary | ICD-10-CM | POA: Diagnosis not present

## 2015-08-29 DIAGNOSIS — R338 Other retention of urine: Secondary | ICD-10-CM | POA: Diagnosis not present

## 2015-08-29 DIAGNOSIS — R5383 Other fatigue: Secondary | ICD-10-CM | POA: Diagnosis not present

## 2015-08-29 DIAGNOSIS — N3001 Acute cystitis with hematuria: Secondary | ICD-10-CM | POA: Diagnosis not present

## 2015-08-29 DIAGNOSIS — I10 Essential (primary) hypertension: Secondary | ICD-10-CM | POA: Diagnosis not present

## 2015-08-29 DIAGNOSIS — E871 Hypo-osmolality and hyponatremia: Secondary | ICD-10-CM | POA: Diagnosis not present

## 2015-08-29 DIAGNOSIS — G309 Alzheimer's disease, unspecified: Secondary | ICD-10-CM | POA: Diagnosis not present

## 2015-08-29 DIAGNOSIS — N401 Enlarged prostate with lower urinary tract symptoms: Secondary | ICD-10-CM | POA: Diagnosis not present

## 2015-09-01 DIAGNOSIS — D72829 Elevated white blood cell count, unspecified: Secondary | ICD-10-CM | POA: Diagnosis not present

## 2015-09-01 DIAGNOSIS — G309 Alzheimer's disease, unspecified: Secondary | ICD-10-CM | POA: Diagnosis not present

## 2015-09-01 DIAGNOSIS — R2689 Other abnormalities of gait and mobility: Secondary | ICD-10-CM | POA: Diagnosis not present

## 2015-09-01 DIAGNOSIS — M6281 Muscle weakness (generalized): Secondary | ICD-10-CM | POA: Diagnosis not present

## 2015-09-01 DIAGNOSIS — N3001 Acute cystitis with hematuria: Secondary | ICD-10-CM | POA: Diagnosis not present

## 2015-09-01 DIAGNOSIS — R5383 Other fatigue: Secondary | ICD-10-CM | POA: Diagnosis not present

## 2015-09-01 DIAGNOSIS — E871 Hypo-osmolality and hyponatremia: Secondary | ICD-10-CM | POA: Diagnosis not present

## 2015-09-01 DIAGNOSIS — R338 Other retention of urine: Secondary | ICD-10-CM | POA: Diagnosis not present

## 2015-09-01 DIAGNOSIS — I10 Essential (primary) hypertension: Secondary | ICD-10-CM | POA: Diagnosis not present

## 2015-09-01 DIAGNOSIS — N401 Enlarged prostate with lower urinary tract symptoms: Secondary | ICD-10-CM | POA: Diagnosis not present

## 2015-09-02 DIAGNOSIS — N401 Enlarged prostate with lower urinary tract symptoms: Secondary | ICD-10-CM | POA: Diagnosis not present

## 2015-09-02 DIAGNOSIS — R2689 Other abnormalities of gait and mobility: Secondary | ICD-10-CM | POA: Diagnosis not present

## 2015-09-02 DIAGNOSIS — G309 Alzheimer's disease, unspecified: Secondary | ICD-10-CM | POA: Diagnosis not present

## 2015-09-02 DIAGNOSIS — D72829 Elevated white blood cell count, unspecified: Secondary | ICD-10-CM | POA: Diagnosis not present

## 2015-09-02 DIAGNOSIS — N3001 Acute cystitis with hematuria: Secondary | ICD-10-CM | POA: Diagnosis not present

## 2015-09-02 DIAGNOSIS — R5383 Other fatigue: Secondary | ICD-10-CM | POA: Diagnosis not present

## 2015-09-02 DIAGNOSIS — I10 Essential (primary) hypertension: Secondary | ICD-10-CM | POA: Diagnosis not present

## 2015-09-02 DIAGNOSIS — R338 Other retention of urine: Secondary | ICD-10-CM | POA: Diagnosis not present

## 2015-09-02 DIAGNOSIS — E871 Hypo-osmolality and hyponatremia: Secondary | ICD-10-CM | POA: Diagnosis not present

## 2015-09-02 DIAGNOSIS — M6281 Muscle weakness (generalized): Secondary | ICD-10-CM | POA: Diagnosis not present

## 2015-09-03 DIAGNOSIS — N401 Enlarged prostate with lower urinary tract symptoms: Secondary | ICD-10-CM | POA: Diagnosis not present

## 2015-09-03 DIAGNOSIS — G309 Alzheimer's disease, unspecified: Secondary | ICD-10-CM | POA: Diagnosis not present

## 2015-09-03 DIAGNOSIS — N3001 Acute cystitis with hematuria: Secondary | ICD-10-CM | POA: Diagnosis not present

## 2015-09-03 DIAGNOSIS — E871 Hypo-osmolality and hyponatremia: Secondary | ICD-10-CM | POA: Diagnosis not present

## 2015-09-03 DIAGNOSIS — R2689 Other abnormalities of gait and mobility: Secondary | ICD-10-CM | POA: Diagnosis not present

## 2015-09-03 DIAGNOSIS — D72829 Elevated white blood cell count, unspecified: Secondary | ICD-10-CM | POA: Diagnosis not present

## 2015-09-03 DIAGNOSIS — R5383 Other fatigue: Secondary | ICD-10-CM | POA: Diagnosis not present

## 2015-09-03 DIAGNOSIS — M6281 Muscle weakness (generalized): Secondary | ICD-10-CM | POA: Diagnosis not present

## 2015-09-03 DIAGNOSIS — R338 Other retention of urine: Secondary | ICD-10-CM | POA: Diagnosis not present

## 2015-09-03 DIAGNOSIS — I10 Essential (primary) hypertension: Secondary | ICD-10-CM | POA: Diagnosis not present

## 2015-09-04 DIAGNOSIS — I10 Essential (primary) hypertension: Secondary | ICD-10-CM | POA: Diagnosis not present

## 2015-09-04 DIAGNOSIS — R2689 Other abnormalities of gait and mobility: Secondary | ICD-10-CM | POA: Diagnosis not present

## 2015-09-04 DIAGNOSIS — D72829 Elevated white blood cell count, unspecified: Secondary | ICD-10-CM | POA: Diagnosis not present

## 2015-09-04 DIAGNOSIS — N3001 Acute cystitis with hematuria: Secondary | ICD-10-CM | POA: Diagnosis not present

## 2015-09-04 DIAGNOSIS — G309 Alzheimer's disease, unspecified: Secondary | ICD-10-CM | POA: Diagnosis not present

## 2015-09-04 DIAGNOSIS — E871 Hypo-osmolality and hyponatremia: Secondary | ICD-10-CM | POA: Diagnosis not present

## 2015-09-04 DIAGNOSIS — R5383 Other fatigue: Secondary | ICD-10-CM | POA: Diagnosis not present

## 2015-09-04 DIAGNOSIS — N401 Enlarged prostate with lower urinary tract symptoms: Secondary | ICD-10-CM | POA: Diagnosis not present

## 2015-09-04 DIAGNOSIS — R338 Other retention of urine: Secondary | ICD-10-CM | POA: Diagnosis not present

## 2015-09-04 DIAGNOSIS — M6281 Muscle weakness (generalized): Secondary | ICD-10-CM | POA: Diagnosis not present

## 2015-09-05 DIAGNOSIS — N401 Enlarged prostate with lower urinary tract symptoms: Secondary | ICD-10-CM | POA: Diagnosis not present

## 2015-09-05 DIAGNOSIS — D72829 Elevated white blood cell count, unspecified: Secondary | ICD-10-CM | POA: Diagnosis not present

## 2015-09-05 DIAGNOSIS — I10 Essential (primary) hypertension: Secondary | ICD-10-CM | POA: Diagnosis not present

## 2015-09-05 DIAGNOSIS — G309 Alzheimer's disease, unspecified: Secondary | ICD-10-CM | POA: Diagnosis not present

## 2015-09-05 DIAGNOSIS — E871 Hypo-osmolality and hyponatremia: Secondary | ICD-10-CM | POA: Diagnosis not present

## 2015-09-05 DIAGNOSIS — N3001 Acute cystitis with hematuria: Secondary | ICD-10-CM | POA: Diagnosis not present

## 2015-09-05 DIAGNOSIS — R5383 Other fatigue: Secondary | ICD-10-CM | POA: Diagnosis not present

## 2015-09-05 DIAGNOSIS — M6281 Muscle weakness (generalized): Secondary | ICD-10-CM | POA: Diagnosis not present

## 2015-09-05 DIAGNOSIS — R2689 Other abnormalities of gait and mobility: Secondary | ICD-10-CM | POA: Diagnosis not present

## 2015-09-05 DIAGNOSIS — R338 Other retention of urine: Secondary | ICD-10-CM | POA: Diagnosis not present

## 2015-09-08 DIAGNOSIS — M6281 Muscle weakness (generalized): Secondary | ICD-10-CM | POA: Diagnosis not present

## 2015-09-08 DIAGNOSIS — R5383 Other fatigue: Secondary | ICD-10-CM | POA: Diagnosis not present

## 2015-09-08 DIAGNOSIS — I10 Essential (primary) hypertension: Secondary | ICD-10-CM | POA: Diagnosis not present

## 2015-09-08 DIAGNOSIS — E871 Hypo-osmolality and hyponatremia: Secondary | ICD-10-CM | POA: Diagnosis not present

## 2015-09-08 DIAGNOSIS — G309 Alzheimer's disease, unspecified: Secondary | ICD-10-CM | POA: Diagnosis not present

## 2015-09-08 DIAGNOSIS — R2689 Other abnormalities of gait and mobility: Secondary | ICD-10-CM | POA: Diagnosis not present

## 2015-09-08 DIAGNOSIS — D72829 Elevated white blood cell count, unspecified: Secondary | ICD-10-CM | POA: Diagnosis not present

## 2015-09-08 DIAGNOSIS — N3001 Acute cystitis with hematuria: Secondary | ICD-10-CM | POA: Diagnosis not present

## 2015-09-08 DIAGNOSIS — N401 Enlarged prostate with lower urinary tract symptoms: Secondary | ICD-10-CM | POA: Diagnosis not present

## 2015-09-08 DIAGNOSIS — R338 Other retention of urine: Secondary | ICD-10-CM | POA: Diagnosis not present

## 2015-09-09 DIAGNOSIS — D72829 Elevated white blood cell count, unspecified: Secondary | ICD-10-CM | POA: Diagnosis not present

## 2015-09-09 DIAGNOSIS — N401 Enlarged prostate with lower urinary tract symptoms: Secondary | ICD-10-CM | POA: Diagnosis not present

## 2015-09-09 DIAGNOSIS — I10 Essential (primary) hypertension: Secondary | ICD-10-CM | POA: Diagnosis not present

## 2015-09-09 DIAGNOSIS — R5383 Other fatigue: Secondary | ICD-10-CM | POA: Diagnosis not present

## 2015-09-09 DIAGNOSIS — R338 Other retention of urine: Secondary | ICD-10-CM | POA: Diagnosis not present

## 2015-09-09 DIAGNOSIS — G309 Alzheimer's disease, unspecified: Secondary | ICD-10-CM | POA: Diagnosis not present

## 2015-09-09 DIAGNOSIS — M6281 Muscle weakness (generalized): Secondary | ICD-10-CM | POA: Diagnosis not present

## 2015-09-09 DIAGNOSIS — N390 Urinary tract infection, site not specified: Secondary | ICD-10-CM | POA: Diagnosis not present

## 2015-09-09 DIAGNOSIS — N3001 Acute cystitis with hematuria: Secondary | ICD-10-CM | POA: Diagnosis not present

## 2015-09-09 DIAGNOSIS — R2689 Other abnormalities of gait and mobility: Secondary | ICD-10-CM | POA: Diagnosis not present

## 2015-09-09 DIAGNOSIS — E871 Hypo-osmolality and hyponatremia: Secondary | ICD-10-CM | POA: Diagnosis not present

## 2015-09-10 DIAGNOSIS — R2689 Other abnormalities of gait and mobility: Secondary | ICD-10-CM | POA: Diagnosis not present

## 2015-09-10 DIAGNOSIS — N3001 Acute cystitis with hematuria: Secondary | ICD-10-CM | POA: Diagnosis not present

## 2015-09-10 DIAGNOSIS — R338 Other retention of urine: Secondary | ICD-10-CM | POA: Diagnosis not present

## 2015-09-10 DIAGNOSIS — G309 Alzheimer's disease, unspecified: Secondary | ICD-10-CM | POA: Diagnosis not present

## 2015-09-10 DIAGNOSIS — D72829 Elevated white blood cell count, unspecified: Secondary | ICD-10-CM | POA: Diagnosis not present

## 2015-09-10 DIAGNOSIS — E871 Hypo-osmolality and hyponatremia: Secondary | ICD-10-CM | POA: Diagnosis not present

## 2015-09-10 DIAGNOSIS — M6281 Muscle weakness (generalized): Secondary | ICD-10-CM | POA: Diagnosis not present

## 2015-09-10 DIAGNOSIS — N401 Enlarged prostate with lower urinary tract symptoms: Secondary | ICD-10-CM | POA: Diagnosis not present

## 2015-09-10 DIAGNOSIS — I10 Essential (primary) hypertension: Secondary | ICD-10-CM | POA: Diagnosis not present

## 2015-09-10 DIAGNOSIS — R5383 Other fatigue: Secondary | ICD-10-CM | POA: Diagnosis not present

## 2015-09-11 DIAGNOSIS — N401 Enlarged prostate with lower urinary tract symptoms: Secondary | ICD-10-CM | POA: Diagnosis not present

## 2015-09-11 DIAGNOSIS — R5383 Other fatigue: Secondary | ICD-10-CM | POA: Diagnosis not present

## 2015-09-11 DIAGNOSIS — N3001 Acute cystitis with hematuria: Secondary | ICD-10-CM | POA: Diagnosis not present

## 2015-09-11 DIAGNOSIS — R338 Other retention of urine: Secondary | ICD-10-CM | POA: Diagnosis not present

## 2015-09-11 DIAGNOSIS — D72829 Elevated white blood cell count, unspecified: Secondary | ICD-10-CM | POA: Diagnosis not present

## 2015-09-11 DIAGNOSIS — E871 Hypo-osmolality and hyponatremia: Secondary | ICD-10-CM | POA: Diagnosis not present

## 2015-09-11 DIAGNOSIS — I10 Essential (primary) hypertension: Secondary | ICD-10-CM | POA: Diagnosis not present

## 2015-09-11 DIAGNOSIS — G309 Alzheimer's disease, unspecified: Secondary | ICD-10-CM | POA: Diagnosis not present

## 2015-09-11 DIAGNOSIS — M6281 Muscle weakness (generalized): Secondary | ICD-10-CM | POA: Diagnosis not present

## 2015-09-11 DIAGNOSIS — R2689 Other abnormalities of gait and mobility: Secondary | ICD-10-CM | POA: Diagnosis not present

## 2015-09-12 DIAGNOSIS — N400 Enlarged prostate without lower urinary tract symptoms: Secondary | ICD-10-CM | POA: Diagnosis not present

## 2015-09-12 DIAGNOSIS — E119 Type 2 diabetes mellitus without complications: Secondary | ICD-10-CM | POA: Diagnosis not present

## 2015-09-12 DIAGNOSIS — G309 Alzheimer's disease, unspecified: Secondary | ICD-10-CM | POA: Diagnosis not present

## 2015-09-12 DIAGNOSIS — N390 Urinary tract infection, site not specified: Secondary | ICD-10-CM | POA: Diagnosis not present

## 2015-09-23 DIAGNOSIS — N390 Urinary tract infection, site not specified: Secondary | ICD-10-CM | POA: Diagnosis not present

## 2015-11-04 DIAGNOSIS — R3912 Poor urinary stream: Secondary | ICD-10-CM | POA: Diagnosis not present

## 2015-11-04 DIAGNOSIS — N312 Flaccid neuropathic bladder, not elsewhere classified: Secondary | ICD-10-CM | POA: Diagnosis not present

## 2015-11-04 DIAGNOSIS — N3020 Other chronic cystitis without hematuria: Secondary | ICD-10-CM | POA: Diagnosis not present

## 2015-11-04 DIAGNOSIS — E119 Type 2 diabetes mellitus without complications: Secondary | ICD-10-CM | POA: Diagnosis not present

## 2015-11-04 DIAGNOSIS — N3090 Cystitis, unspecified without hematuria: Secondary | ICD-10-CM | POA: Diagnosis not present

## 2015-11-04 DIAGNOSIS — E162 Hypoglycemia, unspecified: Secondary | ICD-10-CM | POA: Diagnosis not present

## 2015-11-11 DIAGNOSIS — N390 Urinary tract infection, site not specified: Secondary | ICD-10-CM | POA: Diagnosis not present

## 2015-12-01 DIAGNOSIS — N390 Urinary tract infection, site not specified: Secondary | ICD-10-CM | POA: Diagnosis not present

## 2015-12-02 DIAGNOSIS — H524 Presbyopia: Secondary | ICD-10-CM | POA: Diagnosis not present

## 2015-12-02 DIAGNOSIS — Z7984 Long term (current) use of oral hypoglycemic drugs: Secondary | ICD-10-CM | POA: Diagnosis not present

## 2015-12-02 DIAGNOSIS — E113291 Type 2 diabetes mellitus with mild nonproliferative diabetic retinopathy without macular edema, right eye: Secondary | ICD-10-CM | POA: Diagnosis not present

## 2015-12-02 DIAGNOSIS — Z794 Long term (current) use of insulin: Secondary | ICD-10-CM | POA: Diagnosis not present

## 2015-12-02 DIAGNOSIS — Z961 Presence of intraocular lens: Secondary | ICD-10-CM | POA: Diagnosis not present

## 2015-12-03 DIAGNOSIS — N390 Urinary tract infection, site not specified: Secondary | ICD-10-CM | POA: Diagnosis not present

## 2015-12-21 DIAGNOSIS — N390 Urinary tract infection, site not specified: Secondary | ICD-10-CM | POA: Diagnosis not present

## 2015-12-24 DIAGNOSIS — N390 Urinary tract infection, site not specified: Secondary | ICD-10-CM | POA: Diagnosis not present

## 2016-01-16 DIAGNOSIS — Z87440 Personal history of urinary (tract) infections: Secondary | ICD-10-CM | POA: Diagnosis not present

## 2016-01-16 DIAGNOSIS — N390 Urinary tract infection, site not specified: Secondary | ICD-10-CM | POA: Diagnosis not present

## 2016-01-16 DIAGNOSIS — R338 Other retention of urine: Secondary | ICD-10-CM | POA: Diagnosis not present

## 2016-01-21 DIAGNOSIS — N400 Enlarged prostate without lower urinary tract symptoms: Secondary | ICD-10-CM | POA: Diagnosis not present

## 2016-01-21 DIAGNOSIS — I10 Essential (primary) hypertension: Secondary | ICD-10-CM | POA: Diagnosis not present

## 2016-01-21 DIAGNOSIS — N390 Urinary tract infection, site not specified: Secondary | ICD-10-CM | POA: Diagnosis not present

## 2016-01-23 DIAGNOSIS — R5383 Other fatigue: Secondary | ICD-10-CM | POA: Diagnosis not present

## 2016-01-23 DIAGNOSIS — I10 Essential (primary) hypertension: Secondary | ICD-10-CM | POA: Diagnosis not present

## 2016-01-23 DIAGNOSIS — N139 Obstructive and reflux uropathy, unspecified: Secondary | ICD-10-CM | POA: Diagnosis not present

## 2016-01-23 DIAGNOSIS — G309 Alzheimer's disease, unspecified: Secondary | ICD-10-CM | POA: Diagnosis not present

## 2016-01-23 DIAGNOSIS — E1165 Type 2 diabetes mellitus with hyperglycemia: Secondary | ICD-10-CM | POA: Diagnosis not present

## 2016-01-23 DIAGNOSIS — D72829 Elevated white blood cell count, unspecified: Secondary | ICD-10-CM | POA: Diagnosis not present

## 2016-01-23 DIAGNOSIS — N138 Other obstructive and reflux uropathy: Secondary | ICD-10-CM | POA: Diagnosis not present

## 2016-01-23 DIAGNOSIS — N401 Enlarged prostate with lower urinary tract symptoms: Secondary | ICD-10-CM | POA: Diagnosis not present

## 2016-01-23 DIAGNOSIS — N3001 Acute cystitis with hematuria: Secondary | ICD-10-CM | POA: Diagnosis not present

## 2016-01-23 DIAGNOSIS — E559 Vitamin D deficiency, unspecified: Secondary | ICD-10-CM | POA: Diagnosis not present

## 2016-01-23 DIAGNOSIS — Z87440 Personal history of urinary (tract) infections: Secondary | ICD-10-CM | POA: Diagnosis not present

## 2016-01-25 DIAGNOSIS — D72829 Elevated white blood cell count, unspecified: Secondary | ICD-10-CM | POA: Diagnosis not present

## 2016-01-28 DIAGNOSIS — G309 Alzheimer's disease, unspecified: Secondary | ICD-10-CM | POA: Diagnosis not present

## 2016-01-28 DIAGNOSIS — M6281 Muscle weakness (generalized): Secondary | ICD-10-CM | POA: Diagnosis not present

## 2016-01-28 DIAGNOSIS — N401 Enlarged prostate with lower urinary tract symptoms: Secondary | ICD-10-CM | POA: Diagnosis not present

## 2016-01-28 DIAGNOSIS — D72829 Elevated white blood cell count, unspecified: Secondary | ICD-10-CM | POA: Diagnosis not present

## 2016-01-28 DIAGNOSIS — E871 Hypo-osmolality and hyponatremia: Secondary | ICD-10-CM | POA: Diagnosis not present

## 2016-01-28 DIAGNOSIS — R338 Other retention of urine: Secondary | ICD-10-CM | POA: Diagnosis not present

## 2016-01-28 DIAGNOSIS — R5383 Other fatigue: Secondary | ICD-10-CM | POA: Diagnosis not present

## 2016-01-28 DIAGNOSIS — N3001 Acute cystitis with hematuria: Secondary | ICD-10-CM | POA: Diagnosis not present

## 2016-01-28 DIAGNOSIS — R2689 Other abnormalities of gait and mobility: Secondary | ICD-10-CM | POA: Diagnosis not present

## 2016-01-28 DIAGNOSIS — N390 Urinary tract infection, site not specified: Secondary | ICD-10-CM | POA: Diagnosis not present

## 2016-01-28 DIAGNOSIS — I10 Essential (primary) hypertension: Secondary | ICD-10-CM | POA: Diagnosis not present

## 2016-01-29 DIAGNOSIS — D72829 Elevated white blood cell count, unspecified: Secondary | ICD-10-CM | POA: Diagnosis not present

## 2016-01-29 DIAGNOSIS — I10 Essential (primary) hypertension: Secondary | ICD-10-CM | POA: Diagnosis not present

## 2016-01-29 DIAGNOSIS — R338 Other retention of urine: Secondary | ICD-10-CM | POA: Diagnosis not present

## 2016-01-29 DIAGNOSIS — R2689 Other abnormalities of gait and mobility: Secondary | ICD-10-CM | POA: Diagnosis not present

## 2016-01-29 DIAGNOSIS — E871 Hypo-osmolality and hyponatremia: Secondary | ICD-10-CM | POA: Diagnosis not present

## 2016-01-29 DIAGNOSIS — N390 Urinary tract infection, site not specified: Secondary | ICD-10-CM | POA: Diagnosis not present

## 2016-01-29 DIAGNOSIS — N3001 Acute cystitis with hematuria: Secondary | ICD-10-CM | POA: Diagnosis not present

## 2016-01-29 DIAGNOSIS — N401 Enlarged prostate with lower urinary tract symptoms: Secondary | ICD-10-CM | POA: Diagnosis not present

## 2016-01-29 DIAGNOSIS — M6281 Muscle weakness (generalized): Secondary | ICD-10-CM | POA: Diagnosis not present

## 2016-01-29 DIAGNOSIS — R5383 Other fatigue: Secondary | ICD-10-CM | POA: Diagnosis not present

## 2016-01-29 DIAGNOSIS — G309 Alzheimer's disease, unspecified: Secondary | ICD-10-CM | POA: Diagnosis not present

## 2016-01-30 DIAGNOSIS — D72829 Elevated white blood cell count, unspecified: Secondary | ICD-10-CM | POA: Diagnosis not present

## 2016-01-30 DIAGNOSIS — N390 Urinary tract infection, site not specified: Secondary | ICD-10-CM | POA: Diagnosis not present

## 2016-01-30 DIAGNOSIS — I10 Essential (primary) hypertension: Secondary | ICD-10-CM | POA: Diagnosis not present

## 2016-01-30 DIAGNOSIS — G309 Alzheimer's disease, unspecified: Secondary | ICD-10-CM | POA: Diagnosis not present

## 2016-01-30 DIAGNOSIS — E871 Hypo-osmolality and hyponatremia: Secondary | ICD-10-CM | POA: Diagnosis not present

## 2016-01-30 DIAGNOSIS — N401 Enlarged prostate with lower urinary tract symptoms: Secondary | ICD-10-CM | POA: Diagnosis not present

## 2016-01-30 DIAGNOSIS — N3001 Acute cystitis with hematuria: Secondary | ICD-10-CM | POA: Diagnosis not present

## 2016-01-30 DIAGNOSIS — R5383 Other fatigue: Secondary | ICD-10-CM | POA: Diagnosis not present

## 2016-01-30 DIAGNOSIS — M6281 Muscle weakness (generalized): Secondary | ICD-10-CM | POA: Diagnosis not present

## 2016-01-30 DIAGNOSIS — R2689 Other abnormalities of gait and mobility: Secondary | ICD-10-CM | POA: Diagnosis not present

## 2016-01-30 DIAGNOSIS — R338 Other retention of urine: Secondary | ICD-10-CM | POA: Diagnosis not present

## 2016-02-02 DIAGNOSIS — M6281 Muscle weakness (generalized): Secondary | ICD-10-CM | POA: Diagnosis not present

## 2016-02-02 DIAGNOSIS — R2689 Other abnormalities of gait and mobility: Secondary | ICD-10-CM | POA: Diagnosis not present

## 2016-02-02 DIAGNOSIS — I10 Essential (primary) hypertension: Secondary | ICD-10-CM | POA: Diagnosis not present

## 2016-02-02 DIAGNOSIS — N401 Enlarged prostate with lower urinary tract symptoms: Secondary | ICD-10-CM | POA: Diagnosis not present

## 2016-02-02 DIAGNOSIS — E871 Hypo-osmolality and hyponatremia: Secondary | ICD-10-CM | POA: Diagnosis not present

## 2016-02-02 DIAGNOSIS — D72829 Elevated white blood cell count, unspecified: Secondary | ICD-10-CM | POA: Diagnosis not present

## 2016-02-02 DIAGNOSIS — N390 Urinary tract infection, site not specified: Secondary | ICD-10-CM | POA: Diagnosis not present

## 2016-02-02 DIAGNOSIS — G309 Alzheimer's disease, unspecified: Secondary | ICD-10-CM | POA: Diagnosis not present

## 2016-02-02 DIAGNOSIS — R5383 Other fatigue: Secondary | ICD-10-CM | POA: Diagnosis not present

## 2016-02-02 DIAGNOSIS — R338 Other retention of urine: Secondary | ICD-10-CM | POA: Diagnosis not present

## 2016-02-02 DIAGNOSIS — N3001 Acute cystitis with hematuria: Secondary | ICD-10-CM | POA: Diagnosis not present

## 2016-02-03 DIAGNOSIS — R338 Other retention of urine: Secondary | ICD-10-CM | POA: Diagnosis not present

## 2016-02-03 DIAGNOSIS — E871 Hypo-osmolality and hyponatremia: Secondary | ICD-10-CM | POA: Diagnosis not present

## 2016-02-03 DIAGNOSIS — N390 Urinary tract infection, site not specified: Secondary | ICD-10-CM | POA: Diagnosis not present

## 2016-02-03 DIAGNOSIS — M6281 Muscle weakness (generalized): Secondary | ICD-10-CM | POA: Diagnosis not present

## 2016-02-03 DIAGNOSIS — I10 Essential (primary) hypertension: Secondary | ICD-10-CM | POA: Diagnosis not present

## 2016-02-03 DIAGNOSIS — R2689 Other abnormalities of gait and mobility: Secondary | ICD-10-CM | POA: Diagnosis not present

## 2016-02-03 DIAGNOSIS — G309 Alzheimer's disease, unspecified: Secondary | ICD-10-CM | POA: Diagnosis not present

## 2016-02-03 DIAGNOSIS — N3001 Acute cystitis with hematuria: Secondary | ICD-10-CM | POA: Diagnosis not present

## 2016-02-03 DIAGNOSIS — N401 Enlarged prostate with lower urinary tract symptoms: Secondary | ICD-10-CM | POA: Diagnosis not present

## 2016-02-03 DIAGNOSIS — R5383 Other fatigue: Secondary | ICD-10-CM | POA: Diagnosis not present

## 2016-02-03 DIAGNOSIS — D72829 Elevated white blood cell count, unspecified: Secondary | ICD-10-CM | POA: Diagnosis not present

## 2016-02-04 DIAGNOSIS — I10 Essential (primary) hypertension: Secondary | ICD-10-CM | POA: Diagnosis not present

## 2016-02-04 DIAGNOSIS — M6281 Muscle weakness (generalized): Secondary | ICD-10-CM | POA: Diagnosis not present

## 2016-02-04 DIAGNOSIS — N401 Enlarged prostate with lower urinary tract symptoms: Secondary | ICD-10-CM | POA: Diagnosis not present

## 2016-02-04 DIAGNOSIS — E871 Hypo-osmolality and hyponatremia: Secondary | ICD-10-CM | POA: Diagnosis not present

## 2016-02-04 DIAGNOSIS — R2689 Other abnormalities of gait and mobility: Secondary | ICD-10-CM | POA: Diagnosis not present

## 2016-02-04 DIAGNOSIS — D72829 Elevated white blood cell count, unspecified: Secondary | ICD-10-CM | POA: Diagnosis not present

## 2016-02-04 DIAGNOSIS — N3001 Acute cystitis with hematuria: Secondary | ICD-10-CM | POA: Diagnosis not present

## 2016-02-04 DIAGNOSIS — G309 Alzheimer's disease, unspecified: Secondary | ICD-10-CM | POA: Diagnosis not present

## 2016-02-04 DIAGNOSIS — R5383 Other fatigue: Secondary | ICD-10-CM | POA: Diagnosis not present

## 2016-02-04 DIAGNOSIS — R338 Other retention of urine: Secondary | ICD-10-CM | POA: Diagnosis not present

## 2016-02-04 DIAGNOSIS — N390 Urinary tract infection, site not specified: Secondary | ICD-10-CM | POA: Diagnosis not present

## 2016-02-05 DIAGNOSIS — R2689 Other abnormalities of gait and mobility: Secondary | ICD-10-CM | POA: Diagnosis not present

## 2016-02-05 DIAGNOSIS — R5383 Other fatigue: Secondary | ICD-10-CM | POA: Diagnosis not present

## 2016-02-05 DIAGNOSIS — N3001 Acute cystitis with hematuria: Secondary | ICD-10-CM | POA: Diagnosis not present

## 2016-02-05 DIAGNOSIS — D72829 Elevated white blood cell count, unspecified: Secondary | ICD-10-CM | POA: Diagnosis not present

## 2016-02-05 DIAGNOSIS — G309 Alzheimer's disease, unspecified: Secondary | ICD-10-CM | POA: Diagnosis not present

## 2016-02-05 DIAGNOSIS — M6281 Muscle weakness (generalized): Secondary | ICD-10-CM | POA: Diagnosis not present

## 2016-02-05 DIAGNOSIS — I10 Essential (primary) hypertension: Secondary | ICD-10-CM | POA: Diagnosis not present

## 2016-02-05 DIAGNOSIS — N401 Enlarged prostate with lower urinary tract symptoms: Secondary | ICD-10-CM | POA: Diagnosis not present

## 2016-02-05 DIAGNOSIS — N390 Urinary tract infection, site not specified: Secondary | ICD-10-CM | POA: Diagnosis not present

## 2016-02-05 DIAGNOSIS — E871 Hypo-osmolality and hyponatremia: Secondary | ICD-10-CM | POA: Diagnosis not present

## 2016-02-05 DIAGNOSIS — R338 Other retention of urine: Secondary | ICD-10-CM | POA: Diagnosis not present

## 2016-02-06 DIAGNOSIS — N3090 Cystitis, unspecified without hematuria: Secondary | ICD-10-CM | POA: Diagnosis not present

## 2016-02-06 DIAGNOSIS — N312 Flaccid neuropathic bladder, not elsewhere classified: Secondary | ICD-10-CM | POA: Diagnosis not present

## 2016-02-06 DIAGNOSIS — E109 Type 1 diabetes mellitus without complications: Secondary | ICD-10-CM | POA: Diagnosis not present

## 2016-02-06 DIAGNOSIS — R338 Other retention of urine: Secondary | ICD-10-CM | POA: Diagnosis not present

## 2016-02-06 DIAGNOSIS — N3001 Acute cystitis with hematuria: Secondary | ICD-10-CM | POA: Diagnosis not present

## 2016-02-07 DIAGNOSIS — N401 Enlarged prostate with lower urinary tract symptoms: Secondary | ICD-10-CM | POA: Diagnosis not present

## 2016-02-07 DIAGNOSIS — I10 Essential (primary) hypertension: Secondary | ICD-10-CM | POA: Diagnosis not present

## 2016-02-07 DIAGNOSIS — N390 Urinary tract infection, site not specified: Secondary | ICD-10-CM | POA: Diagnosis not present

## 2016-02-07 DIAGNOSIS — D72829 Elevated white blood cell count, unspecified: Secondary | ICD-10-CM | POA: Diagnosis not present

## 2016-02-07 DIAGNOSIS — E871 Hypo-osmolality and hyponatremia: Secondary | ICD-10-CM | POA: Diagnosis not present

## 2016-02-07 DIAGNOSIS — R2689 Other abnormalities of gait and mobility: Secondary | ICD-10-CM | POA: Diagnosis not present

## 2016-02-07 DIAGNOSIS — G309 Alzheimer's disease, unspecified: Secondary | ICD-10-CM | POA: Diagnosis not present

## 2016-02-07 DIAGNOSIS — M6281 Muscle weakness (generalized): Secondary | ICD-10-CM | POA: Diagnosis not present

## 2016-02-07 DIAGNOSIS — R338 Other retention of urine: Secondary | ICD-10-CM | POA: Diagnosis not present

## 2016-02-07 DIAGNOSIS — N3001 Acute cystitis with hematuria: Secondary | ICD-10-CM | POA: Diagnosis not present

## 2016-02-07 DIAGNOSIS — R5383 Other fatigue: Secondary | ICD-10-CM | POA: Diagnosis not present

## 2016-02-08 DIAGNOSIS — M6281 Muscle weakness (generalized): Secondary | ICD-10-CM | POA: Diagnosis not present

## 2016-02-08 DIAGNOSIS — R5383 Other fatigue: Secondary | ICD-10-CM | POA: Diagnosis not present

## 2016-02-08 DIAGNOSIS — N390 Urinary tract infection, site not specified: Secondary | ICD-10-CM | POA: Diagnosis not present

## 2016-02-08 DIAGNOSIS — I10 Essential (primary) hypertension: Secondary | ICD-10-CM | POA: Diagnosis not present

## 2016-02-08 DIAGNOSIS — D72829 Elevated white blood cell count, unspecified: Secondary | ICD-10-CM | POA: Diagnosis not present

## 2016-02-08 DIAGNOSIS — N3001 Acute cystitis with hematuria: Secondary | ICD-10-CM | POA: Diagnosis not present

## 2016-02-08 DIAGNOSIS — R2689 Other abnormalities of gait and mobility: Secondary | ICD-10-CM | POA: Diagnosis not present

## 2016-02-08 DIAGNOSIS — N401 Enlarged prostate with lower urinary tract symptoms: Secondary | ICD-10-CM | POA: Diagnosis not present

## 2016-02-08 DIAGNOSIS — R338 Other retention of urine: Secondary | ICD-10-CM | POA: Diagnosis not present

## 2016-02-08 DIAGNOSIS — E871 Hypo-osmolality and hyponatremia: Secondary | ICD-10-CM | POA: Diagnosis not present

## 2016-02-08 DIAGNOSIS — G309 Alzheimer's disease, unspecified: Secondary | ICD-10-CM | POA: Diagnosis not present

## 2016-02-09 DIAGNOSIS — E871 Hypo-osmolality and hyponatremia: Secondary | ICD-10-CM | POA: Diagnosis not present

## 2016-02-09 DIAGNOSIS — D72829 Elevated white blood cell count, unspecified: Secondary | ICD-10-CM | POA: Diagnosis not present

## 2016-02-09 DIAGNOSIS — M6281 Muscle weakness (generalized): Secondary | ICD-10-CM | POA: Diagnosis not present

## 2016-02-09 DIAGNOSIS — I10 Essential (primary) hypertension: Secondary | ICD-10-CM | POA: Diagnosis not present

## 2016-02-09 DIAGNOSIS — G309 Alzheimer's disease, unspecified: Secondary | ICD-10-CM | POA: Diagnosis not present

## 2016-02-09 DIAGNOSIS — R5383 Other fatigue: Secondary | ICD-10-CM | POA: Diagnosis not present

## 2016-02-09 DIAGNOSIS — R2689 Other abnormalities of gait and mobility: Secondary | ICD-10-CM | POA: Diagnosis not present

## 2016-02-09 DIAGNOSIS — N401 Enlarged prostate with lower urinary tract symptoms: Secondary | ICD-10-CM | POA: Diagnosis not present

## 2016-02-09 DIAGNOSIS — N3001 Acute cystitis with hematuria: Secondary | ICD-10-CM | POA: Diagnosis not present

## 2016-02-09 DIAGNOSIS — N390 Urinary tract infection, site not specified: Secondary | ICD-10-CM | POA: Diagnosis not present

## 2016-02-09 DIAGNOSIS — R338 Other retention of urine: Secondary | ICD-10-CM | POA: Diagnosis not present

## 2016-02-11 DIAGNOSIS — N401 Enlarged prostate with lower urinary tract symptoms: Secondary | ICD-10-CM | POA: Diagnosis not present

## 2016-02-11 DIAGNOSIS — R338 Other retention of urine: Secondary | ICD-10-CM | POA: Diagnosis not present

## 2016-02-11 DIAGNOSIS — M6281 Muscle weakness (generalized): Secondary | ICD-10-CM | POA: Diagnosis not present

## 2016-02-11 DIAGNOSIS — N390 Urinary tract infection, site not specified: Secondary | ICD-10-CM | POA: Diagnosis not present

## 2016-02-11 DIAGNOSIS — E871 Hypo-osmolality and hyponatremia: Secondary | ICD-10-CM | POA: Diagnosis not present

## 2016-02-11 DIAGNOSIS — N3001 Acute cystitis with hematuria: Secondary | ICD-10-CM | POA: Diagnosis not present

## 2016-02-11 DIAGNOSIS — R2689 Other abnormalities of gait and mobility: Secondary | ICD-10-CM | POA: Diagnosis not present

## 2016-02-11 DIAGNOSIS — I10 Essential (primary) hypertension: Secondary | ICD-10-CM | POA: Diagnosis not present

## 2016-02-11 DIAGNOSIS — G309 Alzheimer's disease, unspecified: Secondary | ICD-10-CM | POA: Diagnosis not present

## 2016-02-11 DIAGNOSIS — R5383 Other fatigue: Secondary | ICD-10-CM | POA: Diagnosis not present

## 2016-02-11 DIAGNOSIS — D72829 Elevated white blood cell count, unspecified: Secondary | ICD-10-CM | POA: Diagnosis not present

## 2016-02-12 DIAGNOSIS — E871 Hypo-osmolality and hyponatremia: Secondary | ICD-10-CM | POA: Diagnosis not present

## 2016-02-12 DIAGNOSIS — R2689 Other abnormalities of gait and mobility: Secondary | ICD-10-CM | POA: Diagnosis not present

## 2016-02-12 DIAGNOSIS — N401 Enlarged prostate with lower urinary tract symptoms: Secondary | ICD-10-CM | POA: Diagnosis not present

## 2016-02-12 DIAGNOSIS — D72829 Elevated white blood cell count, unspecified: Secondary | ICD-10-CM | POA: Diagnosis not present

## 2016-02-12 DIAGNOSIS — G309 Alzheimer's disease, unspecified: Secondary | ICD-10-CM | POA: Diagnosis not present

## 2016-02-12 DIAGNOSIS — N3001 Acute cystitis with hematuria: Secondary | ICD-10-CM | POA: Diagnosis not present

## 2016-02-12 DIAGNOSIS — N390 Urinary tract infection, site not specified: Secondary | ICD-10-CM | POA: Diagnosis not present

## 2016-02-12 DIAGNOSIS — R338 Other retention of urine: Secondary | ICD-10-CM | POA: Diagnosis not present

## 2016-02-12 DIAGNOSIS — I10 Essential (primary) hypertension: Secondary | ICD-10-CM | POA: Diagnosis not present

## 2016-02-12 DIAGNOSIS — M6281 Muscle weakness (generalized): Secondary | ICD-10-CM | POA: Diagnosis not present

## 2016-02-12 DIAGNOSIS — R5383 Other fatigue: Secondary | ICD-10-CM | POA: Diagnosis not present

## 2016-02-13 DIAGNOSIS — N3001 Acute cystitis with hematuria: Secondary | ICD-10-CM | POA: Diagnosis not present

## 2016-02-13 DIAGNOSIS — N401 Enlarged prostate with lower urinary tract symptoms: Secondary | ICD-10-CM | POA: Diagnosis not present

## 2016-02-13 DIAGNOSIS — N390 Urinary tract infection, site not specified: Secondary | ICD-10-CM | POA: Diagnosis not present

## 2016-02-13 DIAGNOSIS — I10 Essential (primary) hypertension: Secondary | ICD-10-CM | POA: Diagnosis not present

## 2016-02-13 DIAGNOSIS — R2689 Other abnormalities of gait and mobility: Secondary | ICD-10-CM | POA: Diagnosis not present

## 2016-02-13 DIAGNOSIS — R338 Other retention of urine: Secondary | ICD-10-CM | POA: Diagnosis not present

## 2016-02-13 DIAGNOSIS — E871 Hypo-osmolality and hyponatremia: Secondary | ICD-10-CM | POA: Diagnosis not present

## 2016-02-13 DIAGNOSIS — D72829 Elevated white blood cell count, unspecified: Secondary | ICD-10-CM | POA: Diagnosis not present

## 2016-02-13 DIAGNOSIS — G309 Alzheimer's disease, unspecified: Secondary | ICD-10-CM | POA: Diagnosis not present

## 2016-02-13 DIAGNOSIS — R5383 Other fatigue: Secondary | ICD-10-CM | POA: Diagnosis not present

## 2016-02-13 DIAGNOSIS — M6281 Muscle weakness (generalized): Secondary | ICD-10-CM | POA: Diagnosis not present

## 2016-02-16 DIAGNOSIS — E871 Hypo-osmolality and hyponatremia: Secondary | ICD-10-CM | POA: Diagnosis not present

## 2016-02-16 DIAGNOSIS — R338 Other retention of urine: Secondary | ICD-10-CM | POA: Diagnosis not present

## 2016-02-16 DIAGNOSIS — M6281 Muscle weakness (generalized): Secondary | ICD-10-CM | POA: Diagnosis not present

## 2016-02-16 DIAGNOSIS — D72829 Elevated white blood cell count, unspecified: Secondary | ICD-10-CM | POA: Diagnosis not present

## 2016-02-16 DIAGNOSIS — R2689 Other abnormalities of gait and mobility: Secondary | ICD-10-CM | POA: Diagnosis not present

## 2016-02-16 DIAGNOSIS — N401 Enlarged prostate with lower urinary tract symptoms: Secondary | ICD-10-CM | POA: Diagnosis not present

## 2016-02-16 DIAGNOSIS — N3001 Acute cystitis with hematuria: Secondary | ICD-10-CM | POA: Diagnosis not present

## 2016-02-16 DIAGNOSIS — R5383 Other fatigue: Secondary | ICD-10-CM | POA: Diagnosis not present

## 2016-02-16 DIAGNOSIS — N390 Urinary tract infection, site not specified: Secondary | ICD-10-CM | POA: Diagnosis not present

## 2016-02-16 DIAGNOSIS — G309 Alzheimer's disease, unspecified: Secondary | ICD-10-CM | POA: Diagnosis not present

## 2016-02-16 DIAGNOSIS — I10 Essential (primary) hypertension: Secondary | ICD-10-CM | POA: Diagnosis not present

## 2016-02-17 DIAGNOSIS — G309 Alzheimer's disease, unspecified: Secondary | ICD-10-CM | POA: Diagnosis not present

## 2016-02-17 DIAGNOSIS — N401 Enlarged prostate with lower urinary tract symptoms: Secondary | ICD-10-CM | POA: Diagnosis not present

## 2016-02-17 DIAGNOSIS — R5383 Other fatigue: Secondary | ICD-10-CM | POA: Diagnosis not present

## 2016-02-17 DIAGNOSIS — N3001 Acute cystitis with hematuria: Secondary | ICD-10-CM | POA: Diagnosis not present

## 2016-02-17 DIAGNOSIS — D72829 Elevated white blood cell count, unspecified: Secondary | ICD-10-CM | POA: Diagnosis not present

## 2016-02-17 DIAGNOSIS — R338 Other retention of urine: Secondary | ICD-10-CM | POA: Diagnosis not present

## 2016-02-17 DIAGNOSIS — R2689 Other abnormalities of gait and mobility: Secondary | ICD-10-CM | POA: Diagnosis not present

## 2016-02-17 DIAGNOSIS — E871 Hypo-osmolality and hyponatremia: Secondary | ICD-10-CM | POA: Diagnosis not present

## 2016-02-17 DIAGNOSIS — M6281 Muscle weakness (generalized): Secondary | ICD-10-CM | POA: Diagnosis not present

## 2016-02-17 DIAGNOSIS — I10 Essential (primary) hypertension: Secondary | ICD-10-CM | POA: Diagnosis not present

## 2016-02-17 DIAGNOSIS — N390 Urinary tract infection, site not specified: Secondary | ICD-10-CM | POA: Diagnosis not present

## 2016-02-18 DIAGNOSIS — G309 Alzheimer's disease, unspecified: Secondary | ICD-10-CM | POA: Diagnosis not present

## 2016-02-18 DIAGNOSIS — R2689 Other abnormalities of gait and mobility: Secondary | ICD-10-CM | POA: Diagnosis not present

## 2016-02-18 DIAGNOSIS — R5383 Other fatigue: Secondary | ICD-10-CM | POA: Diagnosis not present

## 2016-02-18 DIAGNOSIS — N390 Urinary tract infection, site not specified: Secondary | ICD-10-CM | POA: Diagnosis not present

## 2016-02-18 DIAGNOSIS — M6281 Muscle weakness (generalized): Secondary | ICD-10-CM | POA: Diagnosis not present

## 2016-02-18 DIAGNOSIS — I10 Essential (primary) hypertension: Secondary | ICD-10-CM | POA: Diagnosis not present

## 2016-02-18 DIAGNOSIS — R338 Other retention of urine: Secondary | ICD-10-CM | POA: Diagnosis not present

## 2016-02-18 DIAGNOSIS — D72829 Elevated white blood cell count, unspecified: Secondary | ICD-10-CM | POA: Diagnosis not present

## 2016-02-18 DIAGNOSIS — N401 Enlarged prostate with lower urinary tract symptoms: Secondary | ICD-10-CM | POA: Diagnosis not present

## 2016-02-18 DIAGNOSIS — E871 Hypo-osmolality and hyponatremia: Secondary | ICD-10-CM | POA: Diagnosis not present

## 2016-02-18 DIAGNOSIS — N3001 Acute cystitis with hematuria: Secondary | ICD-10-CM | POA: Diagnosis not present

## 2016-02-19 DIAGNOSIS — N390 Urinary tract infection, site not specified: Secondary | ICD-10-CM | POA: Diagnosis not present

## 2016-02-19 DIAGNOSIS — I10 Essential (primary) hypertension: Secondary | ICD-10-CM | POA: Diagnosis not present

## 2016-02-19 DIAGNOSIS — R338 Other retention of urine: Secondary | ICD-10-CM | POA: Diagnosis not present

## 2016-02-19 DIAGNOSIS — M6281 Muscle weakness (generalized): Secondary | ICD-10-CM | POA: Diagnosis not present

## 2016-02-19 DIAGNOSIS — E871 Hypo-osmolality and hyponatremia: Secondary | ICD-10-CM | POA: Diagnosis not present

## 2016-02-19 DIAGNOSIS — N3001 Acute cystitis with hematuria: Secondary | ICD-10-CM | POA: Diagnosis not present

## 2016-02-19 DIAGNOSIS — R2689 Other abnormalities of gait and mobility: Secondary | ICD-10-CM | POA: Diagnosis not present

## 2016-02-19 DIAGNOSIS — R5383 Other fatigue: Secondary | ICD-10-CM | POA: Diagnosis not present

## 2016-02-19 DIAGNOSIS — D72829 Elevated white blood cell count, unspecified: Secondary | ICD-10-CM | POA: Diagnosis not present

## 2016-02-19 DIAGNOSIS — N401 Enlarged prostate with lower urinary tract symptoms: Secondary | ICD-10-CM | POA: Diagnosis not present

## 2016-02-19 DIAGNOSIS — G309 Alzheimer's disease, unspecified: Secondary | ICD-10-CM | POA: Diagnosis not present

## 2016-02-20 DIAGNOSIS — N390 Urinary tract infection, site not specified: Secondary | ICD-10-CM | POA: Diagnosis not present

## 2016-02-20 DIAGNOSIS — N401 Enlarged prostate with lower urinary tract symptoms: Secondary | ICD-10-CM | POA: Diagnosis not present

## 2016-02-20 DIAGNOSIS — G309 Alzheimer's disease, unspecified: Secondary | ICD-10-CM | POA: Diagnosis not present

## 2016-02-20 DIAGNOSIS — R338 Other retention of urine: Secondary | ICD-10-CM | POA: Diagnosis not present

## 2016-02-20 DIAGNOSIS — R2689 Other abnormalities of gait and mobility: Secondary | ICD-10-CM | POA: Diagnosis not present

## 2016-02-20 DIAGNOSIS — M6281 Muscle weakness (generalized): Secondary | ICD-10-CM | POA: Diagnosis not present

## 2016-02-20 DIAGNOSIS — D72829 Elevated white blood cell count, unspecified: Secondary | ICD-10-CM | POA: Diagnosis not present

## 2016-02-20 DIAGNOSIS — R5383 Other fatigue: Secondary | ICD-10-CM | POA: Diagnosis not present

## 2016-02-20 DIAGNOSIS — E871 Hypo-osmolality and hyponatremia: Secondary | ICD-10-CM | POA: Diagnosis not present

## 2016-02-20 DIAGNOSIS — N3001 Acute cystitis with hematuria: Secondary | ICD-10-CM | POA: Diagnosis not present

## 2016-02-20 DIAGNOSIS — I10 Essential (primary) hypertension: Secondary | ICD-10-CM | POA: Diagnosis not present

## 2016-02-23 DIAGNOSIS — N390 Urinary tract infection, site not specified: Secondary | ICD-10-CM | POA: Diagnosis not present

## 2016-02-23 DIAGNOSIS — R2689 Other abnormalities of gait and mobility: Secondary | ICD-10-CM | POA: Diagnosis not present

## 2016-02-23 DIAGNOSIS — D72829 Elevated white blood cell count, unspecified: Secondary | ICD-10-CM | POA: Diagnosis not present

## 2016-02-23 DIAGNOSIS — R338 Other retention of urine: Secondary | ICD-10-CM | POA: Diagnosis not present

## 2016-02-23 DIAGNOSIS — N401 Enlarged prostate with lower urinary tract symptoms: Secondary | ICD-10-CM | POA: Diagnosis not present

## 2016-02-23 DIAGNOSIS — G309 Alzheimer's disease, unspecified: Secondary | ICD-10-CM | POA: Diagnosis not present

## 2016-02-23 DIAGNOSIS — M6281 Muscle weakness (generalized): Secondary | ICD-10-CM | POA: Diagnosis not present

## 2016-02-23 DIAGNOSIS — E871 Hypo-osmolality and hyponatremia: Secondary | ICD-10-CM | POA: Diagnosis not present

## 2016-02-23 DIAGNOSIS — N3001 Acute cystitis with hematuria: Secondary | ICD-10-CM | POA: Diagnosis not present

## 2016-02-23 DIAGNOSIS — R5383 Other fatigue: Secondary | ICD-10-CM | POA: Diagnosis not present

## 2016-02-23 DIAGNOSIS — I10 Essential (primary) hypertension: Secondary | ICD-10-CM | POA: Diagnosis not present

## 2016-02-24 DIAGNOSIS — R2689 Other abnormalities of gait and mobility: Secondary | ICD-10-CM | POA: Diagnosis not present

## 2016-02-24 DIAGNOSIS — N390 Urinary tract infection, site not specified: Secondary | ICD-10-CM | POA: Diagnosis not present

## 2016-02-24 DIAGNOSIS — R338 Other retention of urine: Secondary | ICD-10-CM | POA: Diagnosis not present

## 2016-02-24 DIAGNOSIS — G309 Alzheimer's disease, unspecified: Secondary | ICD-10-CM | POA: Diagnosis not present

## 2016-02-24 DIAGNOSIS — D72829 Elevated white blood cell count, unspecified: Secondary | ICD-10-CM | POA: Diagnosis not present

## 2016-02-24 DIAGNOSIS — N401 Enlarged prostate with lower urinary tract symptoms: Secondary | ICD-10-CM | POA: Diagnosis not present

## 2016-02-24 DIAGNOSIS — N3001 Acute cystitis with hematuria: Secondary | ICD-10-CM | POA: Diagnosis not present

## 2016-02-24 DIAGNOSIS — E871 Hypo-osmolality and hyponatremia: Secondary | ICD-10-CM | POA: Diagnosis not present

## 2016-02-24 DIAGNOSIS — M6281 Muscle weakness (generalized): Secondary | ICD-10-CM | POA: Diagnosis not present

## 2016-02-24 DIAGNOSIS — R5383 Other fatigue: Secondary | ICD-10-CM | POA: Diagnosis not present

## 2016-02-24 DIAGNOSIS — I10 Essential (primary) hypertension: Secondary | ICD-10-CM | POA: Diagnosis not present

## 2016-02-25 DIAGNOSIS — E871 Hypo-osmolality and hyponatremia: Secondary | ICD-10-CM | POA: Diagnosis not present

## 2016-02-25 DIAGNOSIS — R338 Other retention of urine: Secondary | ICD-10-CM | POA: Diagnosis not present

## 2016-02-25 DIAGNOSIS — G309 Alzheimer's disease, unspecified: Secondary | ICD-10-CM | POA: Diagnosis not present

## 2016-02-25 DIAGNOSIS — N401 Enlarged prostate with lower urinary tract symptoms: Secondary | ICD-10-CM | POA: Diagnosis not present

## 2016-02-25 DIAGNOSIS — M6281 Muscle weakness (generalized): Secondary | ICD-10-CM | POA: Diagnosis not present

## 2016-02-25 DIAGNOSIS — R2689 Other abnormalities of gait and mobility: Secondary | ICD-10-CM | POA: Diagnosis not present

## 2016-02-25 DIAGNOSIS — R5383 Other fatigue: Secondary | ICD-10-CM | POA: Diagnosis not present

## 2016-02-25 DIAGNOSIS — N390 Urinary tract infection, site not specified: Secondary | ICD-10-CM | POA: Diagnosis not present

## 2016-02-25 DIAGNOSIS — I10 Essential (primary) hypertension: Secondary | ICD-10-CM | POA: Diagnosis not present

## 2016-02-25 DIAGNOSIS — N3001 Acute cystitis with hematuria: Secondary | ICD-10-CM | POA: Diagnosis not present

## 2016-02-25 DIAGNOSIS — D72829 Elevated white blood cell count, unspecified: Secondary | ICD-10-CM | POA: Diagnosis not present

## 2016-02-26 DIAGNOSIS — R338 Other retention of urine: Secondary | ICD-10-CM | POA: Diagnosis not present

## 2016-02-26 DIAGNOSIS — E871 Hypo-osmolality and hyponatremia: Secondary | ICD-10-CM | POA: Diagnosis not present

## 2016-02-26 DIAGNOSIS — R5383 Other fatigue: Secondary | ICD-10-CM | POA: Diagnosis not present

## 2016-02-26 DIAGNOSIS — N3001 Acute cystitis with hematuria: Secondary | ICD-10-CM | POA: Diagnosis not present

## 2016-02-26 DIAGNOSIS — R2689 Other abnormalities of gait and mobility: Secondary | ICD-10-CM | POA: Diagnosis not present

## 2016-02-26 DIAGNOSIS — M6281 Muscle weakness (generalized): Secondary | ICD-10-CM | POA: Diagnosis not present

## 2016-02-26 DIAGNOSIS — G309 Alzheimer's disease, unspecified: Secondary | ICD-10-CM | POA: Diagnosis not present

## 2016-02-26 DIAGNOSIS — D72829 Elevated white blood cell count, unspecified: Secondary | ICD-10-CM | POA: Diagnosis not present

## 2016-02-26 DIAGNOSIS — N401 Enlarged prostate with lower urinary tract symptoms: Secondary | ICD-10-CM | POA: Diagnosis not present

## 2016-02-26 DIAGNOSIS — I10 Essential (primary) hypertension: Secondary | ICD-10-CM | POA: Diagnosis not present

## 2016-02-26 DIAGNOSIS — N390 Urinary tract infection, site not specified: Secondary | ICD-10-CM | POA: Diagnosis not present

## 2016-02-27 DIAGNOSIS — N3001 Acute cystitis with hematuria: Secondary | ICD-10-CM | POA: Diagnosis not present

## 2016-02-28 DIAGNOSIS — N390 Urinary tract infection, site not specified: Secondary | ICD-10-CM | POA: Diagnosis not present

## 2016-02-28 DIAGNOSIS — N400 Enlarged prostate without lower urinary tract symptoms: Secondary | ICD-10-CM | POA: Diagnosis not present

## 2016-02-28 DIAGNOSIS — G309 Alzheimer's disease, unspecified: Secondary | ICD-10-CM | POA: Diagnosis not present

## 2016-04-02 DIAGNOSIS — N401 Enlarged prostate with lower urinary tract symptoms: Secondary | ICD-10-CM | POA: Diagnosis not present

## 2016-04-03 DIAGNOSIS — G309 Alzheimer's disease, unspecified: Secondary | ICD-10-CM | POA: Diagnosis not present

## 2016-04-03 DIAGNOSIS — I10 Essential (primary) hypertension: Secondary | ICD-10-CM | POA: Diagnosis not present

## 2016-04-03 DIAGNOSIS — N400 Enlarged prostate without lower urinary tract symptoms: Secondary | ICD-10-CM | POA: Diagnosis not present

## 2016-05-07 DIAGNOSIS — N3000 Acute cystitis without hematuria: Secondary | ICD-10-CM | POA: Diagnosis not present

## 2016-05-07 DIAGNOSIS — N3090 Cystitis, unspecified without hematuria: Secondary | ICD-10-CM | POA: Diagnosis not present

## 2016-05-07 DIAGNOSIS — N318 Other neuromuscular dysfunction of bladder: Secondary | ICD-10-CM | POA: Diagnosis not present

## 2016-05-09 DIAGNOSIS — I10 Essential (primary) hypertension: Secondary | ICD-10-CM | POA: Diagnosis not present

## 2016-05-09 DIAGNOSIS — G309 Alzheimer's disease, unspecified: Secondary | ICD-10-CM | POA: Diagnosis not present

## 2016-05-09 DIAGNOSIS — N329 Bladder disorder, unspecified: Secondary | ICD-10-CM | POA: Diagnosis not present

## 2016-05-09 DIAGNOSIS — N400 Enlarged prostate without lower urinary tract symptoms: Secondary | ICD-10-CM | POA: Diagnosis not present

## 2016-05-17 DIAGNOSIS — Z79899 Other long term (current) drug therapy: Secondary | ICD-10-CM | POA: Diagnosis not present

## 2016-05-17 DIAGNOSIS — I10 Essential (primary) hypertension: Secondary | ICD-10-CM | POA: Diagnosis not present

## 2017-11-25 DIAGNOSIS — G309 Alzheimer's disease, unspecified: Secondary | ICD-10-CM | POA: Diagnosis not present

## 2017-11-25 DIAGNOSIS — E1159 Type 2 diabetes mellitus with other circulatory complications: Secondary | ICD-10-CM | POA: Diagnosis not present

## 2017-12-05 DIAGNOSIS — Z961 Presence of intraocular lens: Secondary | ICD-10-CM | POA: Diagnosis not present

## 2017-12-05 DIAGNOSIS — Z794 Long term (current) use of insulin: Secondary | ICD-10-CM | POA: Diagnosis not present

## 2017-12-05 DIAGNOSIS — E113293 Type 2 diabetes mellitus with mild nonproliferative diabetic retinopathy without macular edema, bilateral: Secondary | ICD-10-CM | POA: Diagnosis not present

## 2017-12-05 DIAGNOSIS — Z7984 Long term (current) use of oral hypoglycemic drugs: Secondary | ICD-10-CM | POA: Diagnosis not present

## 2017-12-16 DIAGNOSIS — N3020 Other chronic cystitis without hematuria: Secondary | ICD-10-CM | POA: Diagnosis not present

## 2017-12-16 DIAGNOSIS — N3090 Cystitis, unspecified without hematuria: Secondary | ICD-10-CM | POA: Diagnosis not present

## 2018-01-24 DIAGNOSIS — Z9181 History of falling: Secondary | ICD-10-CM | POA: Diagnosis not present

## 2018-01-24 DIAGNOSIS — Z139 Encounter for screening, unspecified: Secondary | ICD-10-CM | POA: Diagnosis not present

## 2018-01-24 DIAGNOSIS — Z0000 Encounter for general adult medical examination without abnormal findings: Secondary | ICD-10-CM | POA: Diagnosis not present

## 2018-02-03 DIAGNOSIS — D72829 Elevated white blood cell count, unspecified: Secondary | ICD-10-CM | POA: Diagnosis not present

## 2018-02-03 DIAGNOSIS — N390 Urinary tract infection, site not specified: Secondary | ICD-10-CM | POA: Diagnosis not present

## 2018-02-03 DIAGNOSIS — Z9181 History of falling: Secondary | ICD-10-CM | POA: Diagnosis not present

## 2018-02-03 DIAGNOSIS — R338 Other retention of urine: Secondary | ICD-10-CM | POA: Diagnosis not present

## 2018-02-03 DIAGNOSIS — I10 Essential (primary) hypertension: Secondary | ICD-10-CM | POA: Diagnosis not present

## 2018-02-03 DIAGNOSIS — E1165 Type 2 diabetes mellitus with hyperglycemia: Secondary | ICD-10-CM | POA: Diagnosis not present

## 2018-02-03 DIAGNOSIS — N3001 Acute cystitis with hematuria: Secondary | ICD-10-CM | POA: Diagnosis not present

## 2018-02-03 DIAGNOSIS — R293 Abnormal posture: Secondary | ICD-10-CM | POA: Diagnosis not present

## 2018-02-03 DIAGNOSIS — Z87440 Personal history of urinary (tract) infections: Secondary | ICD-10-CM | POA: Diagnosis not present

## 2018-02-03 DIAGNOSIS — G309 Alzheimer's disease, unspecified: Secondary | ICD-10-CM | POA: Diagnosis not present

## 2018-02-03 DIAGNOSIS — R278 Other lack of coordination: Secondary | ICD-10-CM | POA: Diagnosis not present

## 2018-02-03 DIAGNOSIS — M6281 Muscle weakness (generalized): Secondary | ICD-10-CM | POA: Diagnosis not present

## 2018-02-03 DIAGNOSIS — N451 Epididymitis: Secondary | ICD-10-CM | POA: Diagnosis not present

## 2018-02-03 DIAGNOSIS — R5383 Other fatigue: Secondary | ICD-10-CM | POA: Diagnosis not present

## 2018-02-03 DIAGNOSIS — E871 Hypo-osmolality and hyponatremia: Secondary | ICD-10-CM | POA: Diagnosis not present

## 2018-02-03 DIAGNOSIS — R2689 Other abnormalities of gait and mobility: Secondary | ICD-10-CM | POA: Diagnosis not present

## 2018-02-03 DIAGNOSIS — R262 Difficulty in walking, not elsewhere classified: Secondary | ICD-10-CM | POA: Diagnosis not present

## 2018-02-13 DIAGNOSIS — E119 Type 2 diabetes mellitus without complications: Secondary | ICD-10-CM | POA: Diagnosis not present

## 2018-02-13 DIAGNOSIS — G309 Alzheimer's disease, unspecified: Secondary | ICD-10-CM | POA: Diagnosis not present

## 2018-03-07 DIAGNOSIS — Z79899 Other long term (current) drug therapy: Secondary | ICD-10-CM | POA: Diagnosis not present

## 2018-04-13 DIAGNOSIS — N3000 Acute cystitis without hematuria: Secondary | ICD-10-CM | POA: Diagnosis not present

## 2018-04-13 DIAGNOSIS — N3090 Cystitis, unspecified without hematuria: Secondary | ICD-10-CM | POA: Diagnosis not present

## 2018-05-03 DIAGNOSIS — Z79899 Other long term (current) drug therapy: Secondary | ICD-10-CM | POA: Diagnosis not present

## 2018-05-12 DIAGNOSIS — Z79899 Other long term (current) drug therapy: Secondary | ICD-10-CM | POA: Diagnosis not present

## 2018-05-19 DIAGNOSIS — E1159 Type 2 diabetes mellitus with other circulatory complications: Secondary | ICD-10-CM | POA: Diagnosis not present

## 2018-05-19 DIAGNOSIS — G309 Alzheimer's disease, unspecified: Secondary | ICD-10-CM | POA: Diagnosis not present

## 2018-05-24 DIAGNOSIS — I5030 Unspecified diastolic (congestive) heart failure: Secondary | ICD-10-CM | POA: Diagnosis not present

## 2018-05-30 DIAGNOSIS — H109 Unspecified conjunctivitis: Secondary | ICD-10-CM | POA: Diagnosis not present

## 2018-06-19 DIAGNOSIS — E11621 Type 2 diabetes mellitus with foot ulcer: Secondary | ICD-10-CM | POA: Diagnosis not present

## 2018-06-19 DIAGNOSIS — L97509 Non-pressure chronic ulcer of other part of unspecified foot with unspecified severity: Secondary | ICD-10-CM | POA: Diagnosis not present

## 2018-06-19 DIAGNOSIS — I10 Essential (primary) hypertension: Secondary | ICD-10-CM | POA: Diagnosis not present

## 2018-06-19 DIAGNOSIS — E1159 Type 2 diabetes mellitus with other circulatory complications: Secondary | ICD-10-CM | POA: Diagnosis not present

## 2018-06-20 DIAGNOSIS — I10 Essential (primary) hypertension: Secondary | ICD-10-CM | POA: Diagnosis not present

## 2018-06-20 DIAGNOSIS — E1152 Type 2 diabetes mellitus with diabetic peripheral angiopathy with gangrene: Secondary | ICD-10-CM | POA: Diagnosis not present

## 2018-06-20 DIAGNOSIS — G309 Alzheimer's disease, unspecified: Secondary | ICD-10-CM | POA: Diagnosis not present

## 2018-06-20 DIAGNOSIS — L97512 Non-pressure chronic ulcer of other part of right foot with fat layer exposed: Secondary | ICD-10-CM | POA: Diagnosis not present

## 2018-06-20 DIAGNOSIS — I96 Gangrene, not elsewhere classified: Secondary | ICD-10-CM | POA: Diagnosis not present

## 2018-06-20 DIAGNOSIS — E11621 Type 2 diabetes mellitus with foot ulcer: Secondary | ICD-10-CM | POA: Diagnosis not present

## 2018-06-20 DIAGNOSIS — L97529 Non-pressure chronic ulcer of other part of left foot with unspecified severity: Secondary | ICD-10-CM | POA: Diagnosis not present

## 2018-06-20 DIAGNOSIS — L97519 Non-pressure chronic ulcer of other part of right foot with unspecified severity: Secondary | ICD-10-CM | POA: Diagnosis not present

## 2018-06-26 DIAGNOSIS — Z7984 Long term (current) use of oral hypoglycemic drugs: Secondary | ICD-10-CM | POA: Diagnosis not present

## 2018-06-26 DIAGNOSIS — Z961 Presence of intraocular lens: Secondary | ICD-10-CM | POA: Diagnosis not present

## 2018-06-26 DIAGNOSIS — Z794 Long term (current) use of insulin: Secondary | ICD-10-CM | POA: Diagnosis not present

## 2018-06-26 DIAGNOSIS — E113293 Type 2 diabetes mellitus with mild nonproliferative diabetic retinopathy without macular edema, bilateral: Secondary | ICD-10-CM | POA: Diagnosis not present

## 2018-06-27 DIAGNOSIS — L97512 Non-pressure chronic ulcer of other part of right foot with fat layer exposed: Secondary | ICD-10-CM | POA: Diagnosis not present

## 2018-06-27 DIAGNOSIS — L97522 Non-pressure chronic ulcer of other part of left foot with fat layer exposed: Secondary | ICD-10-CM | POA: Diagnosis not present

## 2018-06-27 DIAGNOSIS — E1152 Type 2 diabetes mellitus with diabetic peripheral angiopathy with gangrene: Secondary | ICD-10-CM | POA: Diagnosis not present

## 2018-06-27 DIAGNOSIS — I96 Gangrene, not elsewhere classified: Secondary | ICD-10-CM | POA: Diagnosis not present

## 2018-06-27 DIAGNOSIS — E11621 Type 2 diabetes mellitus with foot ulcer: Secondary | ICD-10-CM | POA: Diagnosis not present

## 2018-06-27 DIAGNOSIS — G309 Alzheimer's disease, unspecified: Secondary | ICD-10-CM | POA: Diagnosis not present

## 2018-07-04 DIAGNOSIS — L97512 Non-pressure chronic ulcer of other part of right foot with fat layer exposed: Secondary | ICD-10-CM | POA: Diagnosis not present

## 2018-07-04 DIAGNOSIS — G309 Alzheimer's disease, unspecified: Secondary | ICD-10-CM | POA: Diagnosis not present

## 2018-07-04 DIAGNOSIS — L97522 Non-pressure chronic ulcer of other part of left foot with fat layer exposed: Secondary | ICD-10-CM | POA: Diagnosis not present

## 2018-07-04 DIAGNOSIS — E11621 Type 2 diabetes mellitus with foot ulcer: Secondary | ICD-10-CM | POA: Diagnosis not present

## 2018-07-07 DIAGNOSIS — R0981 Nasal congestion: Secondary | ICD-10-CM | POA: Diagnosis not present

## 2018-07-07 DIAGNOSIS — R05 Cough: Secondary | ICD-10-CM | POA: Diagnosis not present

## 2018-07-07 DIAGNOSIS — Z79899 Other long term (current) drug therapy: Secondary | ICD-10-CM | POA: Diagnosis not present

## 2018-07-10 DIAGNOSIS — E11621 Type 2 diabetes mellitus with foot ulcer: Secondary | ICD-10-CM | POA: Diagnosis not present

## 2018-07-10 DIAGNOSIS — L97512 Non-pressure chronic ulcer of other part of right foot with fat layer exposed: Secondary | ICD-10-CM | POA: Diagnosis not present

## 2018-07-10 DIAGNOSIS — G309 Alzheimer's disease, unspecified: Secondary | ICD-10-CM | POA: Diagnosis not present

## 2018-07-17 DIAGNOSIS — E11621 Type 2 diabetes mellitus with foot ulcer: Secondary | ICD-10-CM | POA: Diagnosis not present

## 2018-07-17 DIAGNOSIS — L97512 Non-pressure chronic ulcer of other part of right foot with fat layer exposed: Secondary | ICD-10-CM | POA: Diagnosis not present

## 2018-07-20 DIAGNOSIS — L97929 Non-pressure chronic ulcer of unspecified part of left lower leg with unspecified severity: Secondary | ICD-10-CM | POA: Diagnosis not present

## 2018-07-20 DIAGNOSIS — E11621 Type 2 diabetes mellitus with foot ulcer: Secondary | ICD-10-CM | POA: Diagnosis not present

## 2018-07-20 DIAGNOSIS — L97919 Non-pressure chronic ulcer of unspecified part of right lower leg with unspecified severity: Secondary | ICD-10-CM | POA: Diagnosis not present

## 2018-07-24 DIAGNOSIS — L97512 Non-pressure chronic ulcer of other part of right foot with fat layer exposed: Secondary | ICD-10-CM | POA: Diagnosis not present

## 2018-07-24 DIAGNOSIS — E11621 Type 2 diabetes mellitus with foot ulcer: Secondary | ICD-10-CM | POA: Diagnosis not present

## 2018-07-24 DIAGNOSIS — L97519 Non-pressure chronic ulcer of other part of right foot with unspecified severity: Secondary | ICD-10-CM | POA: Diagnosis not present

## 2018-07-24 DIAGNOSIS — I70235 Atherosclerosis of native arteries of right leg with ulceration of other part of foot: Secondary | ICD-10-CM | POA: Diagnosis not present

## 2018-07-24 DIAGNOSIS — G309 Alzheimer's disease, unspecified: Secondary | ICD-10-CM | POA: Diagnosis not present

## 2018-07-31 DIAGNOSIS — E11621 Type 2 diabetes mellitus with foot ulcer: Secondary | ICD-10-CM | POA: Diagnosis not present

## 2018-07-31 DIAGNOSIS — G309 Alzheimer's disease, unspecified: Secondary | ICD-10-CM | POA: Diagnosis not present

## 2018-07-31 DIAGNOSIS — L97512 Non-pressure chronic ulcer of other part of right foot with fat layer exposed: Secondary | ICD-10-CM | POA: Diagnosis not present

## 2018-08-04 DIAGNOSIS — E11621 Type 2 diabetes mellitus with foot ulcer: Secondary | ICD-10-CM | POA: Diagnosis not present

## 2018-08-04 DIAGNOSIS — I70213 Atherosclerosis of native arteries of extremities with intermittent claudication, bilateral legs: Secondary | ICD-10-CM | POA: Diagnosis not present

## 2018-08-07 DIAGNOSIS — L97512 Non-pressure chronic ulcer of other part of right foot with fat layer exposed: Secondary | ICD-10-CM | POA: Diagnosis not present

## 2018-08-07 DIAGNOSIS — L97519 Non-pressure chronic ulcer of other part of right foot with unspecified severity: Secondary | ICD-10-CM | POA: Diagnosis not present

## 2018-08-07 DIAGNOSIS — G309 Alzheimer's disease, unspecified: Secondary | ICD-10-CM | POA: Diagnosis not present

## 2018-08-07 DIAGNOSIS — E11621 Type 2 diabetes mellitus with foot ulcer: Secondary | ICD-10-CM | POA: Diagnosis not present

## 2018-08-11 ENCOUNTER — Other Ambulatory Visit: Payer: Self-pay | Admitting: General Surgery

## 2018-08-17 ENCOUNTER — Other Ambulatory Visit: Payer: Self-pay | Admitting: General Surgery

## 2018-08-18 DIAGNOSIS — M7989 Other specified soft tissue disorders: Secondary | ICD-10-CM | POA: Diagnosis not present

## 2018-08-18 DIAGNOSIS — R531 Weakness: Secondary | ICD-10-CM | POA: Diagnosis not present

## 2018-08-18 DIAGNOSIS — L03031 Cellulitis of right toe: Secondary | ICD-10-CM | POA: Diagnosis not present

## 2018-08-18 DIAGNOSIS — A419 Sepsis, unspecified organism: Secondary | ICD-10-CM | POA: Diagnosis not present

## 2018-08-18 DIAGNOSIS — E11621 Type 2 diabetes mellitus with foot ulcer: Secondary | ICD-10-CM | POA: Diagnosis not present

## 2018-08-18 DIAGNOSIS — T83511A Infection and inflammatory reaction due to indwelling urethral catheter, initial encounter: Secondary | ICD-10-CM | POA: Diagnosis not present

## 2018-08-18 DIAGNOSIS — L97509 Non-pressure chronic ulcer of other part of unspecified foot with unspecified severity: Secondary | ICD-10-CM | POA: Diagnosis not present

## 2018-08-18 DIAGNOSIS — I96 Gangrene, not elsewhere classified: Secondary | ICD-10-CM | POA: Diagnosis not present

## 2018-08-18 DIAGNOSIS — I998 Other disorder of circulatory system: Secondary | ICD-10-CM | POA: Diagnosis not present

## 2018-08-18 DIAGNOSIS — R0902 Hypoxemia: Secondary | ICD-10-CM | POA: Diagnosis not present

## 2018-08-18 DIAGNOSIS — I10 Essential (primary) hypertension: Secondary | ICD-10-CM | POA: Diagnosis not present

## 2018-08-18 DIAGNOSIS — E1159 Type 2 diabetes mellitus with other circulatory complications: Secondary | ICD-10-CM | POA: Diagnosis not present

## 2018-08-19 ENCOUNTER — Encounter (HOSPITAL_COMMUNITY): Payer: Self-pay

## 2018-08-19 ENCOUNTER — Inpatient Hospital Stay (HOSPITAL_COMMUNITY)
Admission: AD | Admit: 2018-08-19 | Discharge: 2018-08-27 | DRG: 239 | Disposition: A | Discharge status: Skilled Nursing Facility | Payer: Medicare Other | Source: Other Acute Inpatient Hospital | Attending: Internal Medicine | Admitting: Internal Medicine

## 2018-08-19 DIAGNOSIS — L089 Local infection of the skin and subcutaneous tissue, unspecified: Secondary | ICD-10-CM | POA: Diagnosis not present

## 2018-08-19 DIAGNOSIS — Z89611 Acquired absence of right leg above knee: Secondary | ICD-10-CM | POA: Diagnosis not present

## 2018-08-19 DIAGNOSIS — Z515 Encounter for palliative care: Secondary | ICD-10-CM | POA: Diagnosis not present

## 2018-08-19 DIAGNOSIS — Z87440 Personal history of urinary (tract) infections: Secondary | ICD-10-CM | POA: Diagnosis not present

## 2018-08-19 DIAGNOSIS — N179 Acute kidney failure, unspecified: Secondary | ICD-10-CM | POA: Diagnosis not present

## 2018-08-19 DIAGNOSIS — Z66 Do not resuscitate: Secondary | ICD-10-CM | POA: Diagnosis present

## 2018-08-19 DIAGNOSIS — E1142 Type 2 diabetes mellitus with diabetic polyneuropathy: Secondary | ICD-10-CM | POA: Diagnosis not present

## 2018-08-19 DIAGNOSIS — Z7189 Other specified counseling: Secondary | ICD-10-CM | POA: Diagnosis not present

## 2018-08-19 DIAGNOSIS — L03031 Cellulitis of right toe: Secondary | ICD-10-CM | POA: Diagnosis not present

## 2018-08-19 DIAGNOSIS — N319 Neuromuscular dysfunction of bladder, unspecified: Secondary | ICD-10-CM | POA: Diagnosis not present

## 2018-08-19 DIAGNOSIS — F0281 Dementia in other diseases classified elsewhere with behavioral disturbance: Secondary | ICD-10-CM | POA: Diagnosis present

## 2018-08-19 DIAGNOSIS — R627 Adult failure to thrive: Secondary | ICD-10-CM | POA: Diagnosis not present

## 2018-08-19 DIAGNOSIS — N390 Urinary tract infection, site not specified: Secondary | ICD-10-CM | POA: Diagnosis present

## 2018-08-19 DIAGNOSIS — E11628 Type 2 diabetes mellitus with other skin complications: Secondary | ICD-10-CM | POA: Diagnosis present

## 2018-08-19 DIAGNOSIS — E119 Type 2 diabetes mellitus without complications: Secondary | ICD-10-CM | POA: Diagnosis not present

## 2018-08-19 DIAGNOSIS — I773 Arterial fibromuscular dysplasia: Secondary | ICD-10-CM | POA: Diagnosis not present

## 2018-08-19 DIAGNOSIS — T83511A Infection and inflammatory reaction due to indwelling urethral catheter, initial encounter: Secondary | ICD-10-CM | POA: Diagnosis present

## 2018-08-19 DIAGNOSIS — Z7401 Bed confinement status: Secondary | ICD-10-CM

## 2018-08-19 DIAGNOSIS — Z6829 Body mass index (BMI) 29.0-29.9, adult: Secondary | ICD-10-CM

## 2018-08-19 DIAGNOSIS — F0280 Dementia in other diseases classified elsewhere without behavioral disturbance: Secondary | ICD-10-CM | POA: Diagnosis present

## 2018-08-19 DIAGNOSIS — M2550 Pain in unspecified joint: Secondary | ICD-10-CM | POA: Diagnosis not present

## 2018-08-19 DIAGNOSIS — G309 Alzheimer's disease, unspecified: Secondary | ICD-10-CM | POA: Diagnosis present

## 2018-08-19 DIAGNOSIS — R338 Other retention of urine: Secondary | ICD-10-CM | POA: Diagnosis not present

## 2018-08-19 DIAGNOSIS — Z87891 Personal history of nicotine dependence: Secondary | ICD-10-CM

## 2018-08-19 DIAGNOSIS — A419 Sepsis, unspecified organism: Secondary | ICD-10-CM | POA: Diagnosis present

## 2018-08-19 DIAGNOSIS — R238 Other skin changes: Secondary | ICD-10-CM | POA: Diagnosis present

## 2018-08-19 DIAGNOSIS — I739 Peripheral vascular disease, unspecified: Secondary | ICD-10-CM | POA: Diagnosis not present

## 2018-08-19 DIAGNOSIS — J101 Influenza due to other identified influenza virus with other respiratory manifestations: Secondary | ICD-10-CM | POA: Diagnosis not present

## 2018-08-19 DIAGNOSIS — E1152 Type 2 diabetes mellitus with diabetic peripheral angiopathy with gangrene: Secondary | ICD-10-CM | POA: Diagnosis not present

## 2018-08-19 DIAGNOSIS — I10 Essential (primary) hypertension: Secondary | ICD-10-CM | POA: Diagnosis present

## 2018-08-19 DIAGNOSIS — Y846 Urinary catheterization as the cause of abnormal reaction of the patient, or of later complication, without mention of misadventure at the time of the procedure: Secondary | ICD-10-CM | POA: Diagnosis not present

## 2018-08-19 DIAGNOSIS — I96 Gangrene, not elsewhere classified: Secondary | ICD-10-CM

## 2018-08-19 DIAGNOSIS — R0902 Hypoxemia: Secondary | ICD-10-CM | POA: Diagnosis not present

## 2018-08-19 DIAGNOSIS — L8990 Pressure ulcer of unspecified site, unspecified stage: Secondary | ICD-10-CM

## 2018-08-19 DIAGNOSIS — I70261 Atherosclerosis of native arteries of extremities with gangrene, right leg: Secondary | ICD-10-CM | POA: Diagnosis not present

## 2018-08-19 DIAGNOSIS — R509 Fever, unspecified: Secondary | ICD-10-CM

## 2018-08-19 DIAGNOSIS — B9620 Unspecified Escherichia coli [E. coli] as the cause of diseases classified elsewhere: Secondary | ICD-10-CM | POA: Diagnosis present

## 2018-08-19 DIAGNOSIS — N401 Enlarged prostate with lower urinary tract symptoms: Secondary | ICD-10-CM | POA: Diagnosis present

## 2018-08-19 DIAGNOSIS — Z833 Family history of diabetes mellitus: Secondary | ICD-10-CM | POA: Diagnosis not present

## 2018-08-19 DIAGNOSIS — T368X5A Adverse effect of other systemic antibiotics, initial encounter: Secondary | ICD-10-CM | POA: Diagnosis not present

## 2018-08-19 DIAGNOSIS — I998 Other disorder of circulatory system: Secondary | ICD-10-CM | POA: Diagnosis not present

## 2018-08-19 DIAGNOSIS — J9601 Acute respiratory failure with hypoxia: Secondary | ICD-10-CM | POA: Diagnosis not present

## 2018-08-19 DIAGNOSIS — Z794 Long term (current) use of insulin: Secondary | ICD-10-CM

## 2018-08-19 DIAGNOSIS — R339 Retention of urine, unspecified: Secondary | ICD-10-CM | POA: Diagnosis present

## 2018-08-19 DIAGNOSIS — Z8249 Family history of ischemic heart disease and other diseases of the circulatory system: Secondary | ICD-10-CM | POA: Diagnosis not present

## 2018-08-19 DIAGNOSIS — J9811 Atelectasis: Secondary | ICD-10-CM | POA: Diagnosis not present

## 2018-08-19 DIAGNOSIS — L03115 Cellulitis of right lower limb: Secondary | ICD-10-CM | POA: Diagnosis not present

## 2018-08-19 LAB — COMPREHENSIVE METABOLIC PANEL
ALBUMIN: 2.6 g/dL — AB (ref 3.5–5.0)
ALT: 19 U/L (ref 0–44)
AST: 29 U/L (ref 15–41)
Alkaline Phosphatase: 57 U/L (ref 38–126)
Anion gap: 13 (ref 5–15)
BUN: 14 mg/dL (ref 8–23)
CHLORIDE: 96 mmol/L — AB (ref 98–111)
CO2: 23 mmol/L (ref 22–32)
Calcium: 8.6 mg/dL — ABNORMAL LOW (ref 8.9–10.3)
Creatinine, Ser: 0.87 mg/dL (ref 0.61–1.24)
GFR calc Af Amer: >60 mL/min (ref >60)
GFR calc non Af Amer: >60 mL/min (ref >60)
GLUCOSE: 237 mg/dL — AB (ref 70–99)
Potassium: 4.4 mmol/L (ref 3.5–5.1)
SODIUM: 132 mmol/L — AB (ref 135–145)
TOTAL PROTEIN: 6.4 g/dL — AB (ref 6.5–8.1)
Total Bilirubin: 0.9 mg/dL (ref 0.3–1.2)

## 2018-08-19 LAB — CBC
HCT: 38.2 % — ABNORMAL LOW (ref 39.0–52.0)
Hemoglobin: 12.4 g/dL — ABNORMAL LOW (ref 13.0–17.0)
MCH: 30 pg (ref 26.0–34.0)
MCHC: 32.5 g/dL (ref 30.0–36.0)
MCV: 92.5 fL (ref 80.0–100.0)
NRBC: 0 % (ref 0.0–0.2)
PLATELETS: 221 10*3/uL (ref 150–400)
RBC: 4.13 MIL/uL — AB (ref 4.22–5.81)
RDW: 12.8 % (ref 11.5–15.5)
WBC: 15.1 10*3/uL — ABNORMAL HIGH (ref 4.0–10.5)

## 2018-08-19 LAB — LACTIC ACID, PLASMA
LACTIC ACID, VENOUS: 1.8 mmol/L (ref 0.5–1.9)
Lactic Acid, Venous: 3.6 mmol/L (ref 0.5–1.9)

## 2018-08-19 LAB — GLUCOSE, CAPILLARY: Glucose-Capillary: 206 mg/dL — ABNORMAL HIGH (ref 70–99)

## 2018-08-19 LAB — MRSA PCR SCREENING: MRSA BY PCR: NEGATIVE

## 2018-08-19 MED ORDER — PIPERACILLIN-TAZOBACTAM 3.375 G IVPB 30 MIN
3.3750 g | Freq: Once | INTRAVENOUS | Status: AC
  Administered 2018-08-19: 3.375 g via INTRAVENOUS
  Filled 2018-08-19 (×2): qty 50

## 2018-08-19 MED ORDER — LISINOPRIL 5 MG PO TABS
5.0000 mg | ORAL_TABLET | Freq: Every day | ORAL | Status: DC
  Administered 2018-08-19 – 2018-08-21 (×3): 5 mg via ORAL
  Filled 2018-08-19 (×3): qty 1

## 2018-08-19 MED ORDER — VITAMIN D 25 MCG (1000 UNIT) PO TABS
2000.0000 [IU] | ORAL_TABLET | Freq: Every day | ORAL | Status: DC
  Administered 2018-08-19 – 2018-08-26 (×8): 2000 [IU] via ORAL
  Filled 2018-08-19 (×8): qty 2

## 2018-08-19 MED ORDER — VANCOMYCIN HCL 10 G IV SOLR
2000.0000 mg | Freq: Once | INTRAVENOUS | Status: AC
  Administered 2018-08-19: 2000 mg via INTRAVENOUS
  Filled 2018-08-19: qty 2000

## 2018-08-19 MED ORDER — TRAZODONE HCL 50 MG PO TABS
25.0000 mg | ORAL_TABLET | Freq: Every day | ORAL | Status: DC
  Administered 2018-08-19 – 2018-08-26 (×8): 25 mg via ORAL
  Filled 2018-08-19 (×8): qty 1

## 2018-08-19 MED ORDER — ACETAMINOPHEN 650 MG RE SUPP: 650.0000 mg | Freq: Four times a day (QID) | RECTAL | Status: DC | PRN

## 2018-08-19 MED ORDER — ALBUTEROL SULFATE (2.5 MG/3ML) 0.083% IN NEBU: 2.5000 mg | INHALATION_SOLUTION | RESPIRATORY_TRACT | Status: DC | PRN

## 2018-08-19 MED ORDER — ACETAMINOPHEN 325 MG PO TABS
650.0000 mg | ORAL_TABLET | Freq: Four times a day (QID) | ORAL | Status: DC | PRN
  Administered 2018-08-23: 650 mg via ORAL
  Filled 2018-08-19: qty 2

## 2018-08-19 MED ORDER — DOCUSATE SODIUM 100 MG PO CAPS
100.0000 mg | ORAL_CAPSULE | Freq: Two times a day (BID) | ORAL | Status: DC
  Administered 2018-08-19 – 2018-08-26 (×15): 100 mg via ORAL
  Filled 2018-08-19 (×15): qty 1

## 2018-08-19 MED ORDER — INSULIN ASPART 100 UNIT/ML ~~LOC~~ SOLN
0.0000 [IU] | Freq: Three times a day (TID) | SUBCUTANEOUS | Status: DC
  Administered 2018-08-20 (×2): 2 [IU] via SUBCUTANEOUS
  Administered 2018-08-20: 1 [IU] via SUBCUTANEOUS
  Administered 2018-08-21 – 2018-08-23 (×4): 2 [IU] via SUBCUTANEOUS
  Administered 2018-08-23 – 2018-08-24 (×2): 1 [IU] via SUBCUTANEOUS
  Administered 2018-08-24: 2 [IU] via SUBCUTANEOUS
  Administered 2018-08-24: 1 [IU] via SUBCUTANEOUS
  Administered 2018-08-25 (×2): 2 [IU] via SUBCUTANEOUS
  Administered 2018-08-26: 1 [IU] via SUBCUTANEOUS
  Administered 2018-08-26: 2 [IU] via SUBCUTANEOUS
  Administered 2018-08-26: 3 [IU] via SUBCUTANEOUS

## 2018-08-19 MED ORDER — SODIUM CHLORIDE 0.9 % IV SOLN
INTRAVENOUS | Status: DC
  Administered 2018-08-19: 22:00:00 via INTRAVENOUS

## 2018-08-19 MED ORDER — DONEPEZIL HCL 10 MG PO TABS
10.0000 mg | ORAL_TABLET | Freq: Every day | ORAL | Status: DC
  Administered 2018-08-19 – 2018-08-26 (×8): 10 mg via ORAL
  Filled 2018-08-19 (×8): qty 1

## 2018-08-19 MED ORDER — PIPERACILLIN-TAZOBACTAM 3.375 G IVPB
3.3750 g | Freq: Three times a day (TID) | INTRAVENOUS | Status: DC
  Administered 2018-08-20 – 2018-08-23 (×10): 3.375 g via INTRAVENOUS
  Filled 2018-08-19 (×10): qty 50

## 2018-08-19 MED ORDER — ENOXAPARIN SODIUM 40 MG/0.4ML ~~LOC~~ SOLN
40.0000 mg | SUBCUTANEOUS | Status: DC
  Administered 2018-08-19 – 2018-08-21 (×3): 40 mg via SUBCUTANEOUS
  Filled 2018-08-19 (×3): qty 0.4

## 2018-08-19 MED ORDER — INSULIN ASPART 100 UNIT/ML ~~LOC~~ SOLN
0.0000 [IU] | Freq: Every day | SUBCUTANEOUS | Status: DC
  Administered 2018-08-19 – 2018-08-24 (×2): 2 [IU] via SUBCUTANEOUS

## 2018-08-19 MED ORDER — DIVALPROEX SODIUM 125 MG PO CSDR
125.0000 mg | DELAYED_RELEASE_CAPSULE | Freq: Three times a day (TID) | ORAL | Status: DC
  Administered 2018-08-19 – 2018-08-27 (×21): 125 mg via ORAL
  Filled 2018-08-19 (×26): qty 1

## 2018-08-19 MED ORDER — VANCOMYCIN HCL 10 G IV SOLR
1500.0000 mg | Freq: Two times a day (BID) | INTRAVENOUS | Status: DC
  Administered 2018-08-20 (×2): 1500 mg via INTRAVENOUS
  Filled 2018-08-19 (×3): qty 1500

## 2018-08-19 MED ORDER — INSULIN DETEMIR 100 UNIT/ML ~~LOC~~ SOLN
25.0000 [IU] | Freq: Two times a day (BID) | SUBCUTANEOUS | Status: DC
  Administered 2018-08-19 – 2018-08-26 (×15): 25 [IU] via SUBCUTANEOUS
  Filled 2018-08-19 (×17): qty 0.25

## 2018-08-19 MED ORDER — ASPIRIN 81 MG PO CHEW
81.0000 mg | CHEWABLE_TABLET | Freq: Every day | ORAL | Status: DC
  Administered 2018-08-19 – 2018-08-26 (×8): 81 mg via ORAL
  Filled 2018-08-19 (×8): qty 1

## 2018-08-19 MED ORDER — MEMANTINE HCL 10 MG PO TABS
10.0000 mg | ORAL_TABLET | Freq: Two times a day (BID) | ORAL | Status: DC
  Administered 2018-08-19 – 2018-08-26 (×15): 10 mg via ORAL
  Filled 2018-08-19 (×15): qty 1

## 2018-08-19 MED ORDER — TRAMADOL HCL 50 MG PO TABS
50.0000 mg | ORAL_TABLET | Freq: Two times a day (BID) | ORAL | Status: DC | PRN
  Administered 2018-08-20 – 2018-08-25 (×4): 50 mg via ORAL
  Filled 2018-08-19 (×5): qty 1

## 2018-08-19 NOTE — Progress Notes (Signed)
Pharmacy Antibiotic Note  Raymond Hunter is a 73 y.o. male admitted on 08/19/2018 with sepsis from gangrenous toe.  Pharmacy has been consulted for Vancomycin and Zosyn dosing.  Plan: Vanc 2 grams IV x 1, then 1500 mg IV q12hr Zosyn 3.375 gm IV q8hr (extended infusion) Will monitor renal function, C&S and vanc levels as appropriate  Height: 5\' 10"  (177.8 cm)(pt daughter stated) Weight: 209 lb (94.8 kg) IBW/kg (Calculated) : 73  Temp (24hrs), Avg:98.3 F (36.8 C), Min:98.3 F (36.8 C), Max:98.3 F (36.8 C)  Recent Labs  Lab 08/19/18 1820  WBC 15.1*  CREATININE 0.87    Estimated Creatinine Clearance: 86.1 mL/min (by C-G formula based on SCr of 0.87 mg/dL).    Not on File  Antimicrobials this admission: Vanc 1/25 >>  Zosyn 1/25 >>    Thank you for allowing pharmacy to be a part of this patient's care.  Jeanella Cara, PharmD, [facility] Clinical Pharmacist Please see AMION for all Pharmacists' Contact Phone Numbers 08/19/2018, 8:18 PM

## 2018-08-19 NOTE — H&P (Signed)
History and Physical    WITT PLITT UXL:[id number] DOB: 1945/12/31 DOA: 08/19/2018  PCP: Maryella Shivers, MD  Primary Urologist: Dr. Vernell Leep  I have briefly reviewed patients previous medical reports in [facility].  Patient coming from: [facility] ED  Chief Complaint: Black right toes.  HPI: BAYLEN BUCKNER is a 73 year old male, resident of [facility] in [place], [place], bedbound for several weeks now, prior to that mobile with the help of a wheelchair, initially transferred from SNF to [facility] on 08/18/2018 due to 2 black right foot toes with surrounding redness.  He was evaluated to have sepsis due to fever, hypotension, tachycardia, elevated lactate, potential right diabetic foot infection and possible UTI.  He was treated per sepsis protocol with IV fluids at 30 mL/kg and initiated on IV vancomycin and Zosyn.  Due to concern for critical right limb ischemia and need for further evaluation at a tertiary center, his case was discussed with Dr. Donzetta Matters vascular surgery and accepted in transfer to [facility].  However possibly due to lack of beds, patient arrived to [facility] almost 24 hours later.  He has PMH of advanced dementia with behavioral abnormalities, DM 2/IDDM with peripheral neuropathy, HTN, BPH, neurogenic bladder, previously used to have in and out catheterization but for the last week has had an indwelling Foley catheter, frequent UTIs, PAD.  Patient is unable to provide any history due to advanced dementia.  History obtained from daughter/healthcare power of attorney at bedside and chart review.  As per daughter, patient had significant lower extremity edema since October 2019, he then developed blistering of his right toes, this progressively got worse, he was referred to [facility] (Dr. Jerilee Hoh) and underwent some debridement.  He also underwent MRI of the feet and arterial study and told to have PAD in both lower  extremities.  He was supposed to have further evaluation including MRA which has not yet been done.  Over the last week daughter noticed darkish discoloration of right first and third toes.  Patient did not complain of much pain.  She sees him almost on a weekly basis.  Today she got a call from the nursing facility that patient was being transferred to the hospital due to black right toes and lethargy.  Patient himself is alert and oriented to self and partly to place.  He denies complaints and specifically denies pain.    Review of Systems:  As per history of presenting illness otherwise unable due to patient's advanced Alzheimer's dementia and unable to provide history.  PMH: DM 2/IDDM with peripheral neuropathy HTN. Advanced Alzheimer's dementia with behavioral abnormalities BPH Neurogenic bladder Frequent UTIs Peripheral artery disease  Past surgical history: Reportedly has had back and feet surgeries.  Social history Former smoker.  Family history Positive for hypertension and diabetes.    Prior to Admission medications   Medication Sig Start Date End Date Taking? Authorizing Provider  aspirin 81 MG chewable tablet Chew 81 mg by mouth daily.   Yes [provider]  cholecalciferol (VITAMIN D3) 25 MCG (1000 UT) tablet Take 2,000 Units by mouth daily.   Yes [provider]  divalproex (DEPAKOTE SPRINKLE) 125 MG capsule Take 125 mg by mouth every 8 (eight) hours.   Yes [provider]  donepezil (ARICEPT) 10 MG tablet Take 10 mg by mouth at bedtime.   Yes [provider]  insulin detemir (LEVEMIR) 100 UNIT/ML injection Inject 50-65 Units into the skin See admin  instructions. Inject 65 units daily in the morning at 0600, and inject 50 units in the evening at 2100.   Yes [provider]  lisinopril (PRINIVIL,ZESTRIL) 5 MG tablet Take 5 mg by mouth daily.   Yes [provider]  LORazepam (ATIVAN) 0.5 MG tablet Take 0.5 mg by  mouth once. 30 minutes prior to scheduled ultrasound   Yes [provider]  memantine (NAMENDA) 10 MG tablet Take 10 mg by mouth 2 (two) times daily.   Yes [provider]  metFORMIN (GLUCOPHAGE) 1000 MG tablet Take 1,000 mg by mouth 2 (two) times daily with a meal.   Yes [provider]  traZODone (DESYREL) 50 MG tablet Take 25 mg by mouth at bedtime.   Yes [provider]    Physical Exam: Vitals:   08/19/18 1749  BP: (!) 120/55  Pulse: 66  Resp: 18  Temp: 98.3 F (36.8 C)  TempSrc: Oral  SpO2: 98%      Constitutional: Pleasant elderly male, moderately built and nourished lying comfortably propped up in bed.  Patient was interviewed and examined with assistance from his RN and daughter at bedside. Eyes: PERTLA, lids and conjunctivae normal ENMT: Mucous membranes are borderline. Posterior pharynx clear of any exudate or lesions.  Patient has dentures. Neck: supple, no masses, no thyromegaly Respiratory: clear to auscultation bilaterally, no wheezing, no crackles. Normal respiratory effort. No accessory muscle use.  Cardiovascular: S1 & S2 heard, regular rate and rhythm, no murmurs / rubs / gallops. No extremity edema. No carotid bruits.  Abdomen: No distension, no tenderness, no masses palpated. No hepatosplenomegaly. Bowel sounds normal.  Musculoskeletal: no clubbing / cyanosis. No joint deformity upper and lower extremities. Good ROM, no contractures. Normal muscle tone.  Skin: As per examination in extremities. Neurologic: CN 2-12 grossly intact. Sensation intact, DTR normal. Strength 5/5 in all 4 limbs.  Psychiatric: Impaired judgment and insight. Alert and oriented x 1. Normal mood.  Extremities: Bilateral femoral and popliteal pulses symmetrically felt.  Unable to appreciate bilateral dorsalis pedis and posterior tibial pulses.  Both feet are warm to touch.  Right foot first and second toe with black discoloration/gangrene with minimal  erythema around the gangrenous area.  No fluctuance or purulent drainage.  No foul smell.  Please see pictures below from 08/19/2018 for details. GU: Has indwelling Foley catheter.          Labs on Admission: I have personally reviewed following labs and imaging studies  CBC: Recent Labs  Lab 08/19/18 1820  WBC 15.1*  HGB 12.4*  HCT 38.2*  MCV 92.5  PLT 250   Basic Metabolic Panel: Labs have been drawn and pending. Labs from outside hospital from 1/24: Sodium 136, potassium 4.6, chloride 100, bicarbonate 21, BUN 30, creatinine 1.2 and glucose of 128.  Urine microscopy at OSH: 3+ protein positive for nitrites 2+ leukocyte Estrace, 10-20 RBCs, too numerous to count WBCs, 4+ bacteria.  Radiological Exams on Admission: No results found.  EKG: Independently reviewed.  Poor baseline/tremor artifact.  Sinus rhythm at 94 bpm, normal axis, Q waves in leads V1-2 otherwise no acute changes.  Assessment/Plan Principal Problem:   Gangrene of toe of right foot (HCC) Active Problems:   Cellulitis of foot, right   Diabetic infection of right foot (HCC)   Sepsis ([place])   Alzheimer's dementia ([place])   Type 2 diabetes mellitus with peripheral neuropathy ([place])   Essential hypertension   Urinary retention   Neurogenic bladder   Pressure injury of  skin     1. PAD with gangrene of right foot (first and third toes and possibly second toe): Vascular surgery/Dr. Donzetta Matters has been consulted and will see patient in a.m.  I discussed with him.  Daughter has not decided yet whether patient is to go through with amputations if indicated. 2. Diabetic right foot infection/cellulitis: Complicating PAD and gangrene.  IV vancomycin and Zosyn.  He could have underlying osteomyelitis. 3. Sepsis: Patient presented to [facility] with sepsis where he met sepsis criteria and was treated per protocol with IV fluids and antibiotics.  Sepsis physiology has resolved.  Continue gentle IV fluids overnight and IV  vancomycin and Zosyn.  Sepsis likely due to diabetic right foot infection/cellulitis complicating gangrene and possible UTI. 4. BPH/neurogenic bladder/urinary retention: Patient used to have in and out catheterization done 3 times daily for a couple of years until recently.  Now has indwelling catheter for the last week.  Urinary catheter was reportedly changed at [facility] on day of admission.  Patient unable to provide history of dysuria.  IV Zosyn should cover. 5. Type II DM with peripheral neuropathy: We will place patient on reduced dose of twice daily Levemir and add NovoLog SSI.  Monitor CBGs closely and adjust insulins as needed. 6. Essential hypertension: Controlled.  Continue lisinopril. 7. Advanced Alzheimer's dementia: Continue Aricept, Namenda and Depakote. 8. Evaded lactate: Noted at OSH.  Follow lactate. 9. Leukocytosis: Secondary to sepsis.  Improving.    DVT prophylaxis: Subcutaneous Lovenox Code Status: DNR.  This was confirmed with patient's daughter/healthcare power of attorney at bedside.  Patient also has a out of facility completed DNR form. Family Communication: Discussed in detail with patient's daughter at bedside, updated care and answered questions. Disposition Plan: DC to SNF pending clinical improvement Consults called: Vascular surgery Admission status: Inpatient, medical bed  Severity of Illness: The appropriate patient status for this patient is INPATIENT. Inpatient status is judged to be reasonable and necessary in order to provide the required intensity of service to ensure the patient's safety. The patient's presenting symptoms, physical exam findings, and initial radiographic and laboratory data in the context of their chronic comorbidities is felt to place them at high risk for further clinical deterioration. Furthermore, it is not anticipated that the patient will be medically stable for discharge from the hospital within 2 midnights of admission. The  following factors support the patient status of inpatient.   " The patient's presenting symptoms include gangrenous right first and third toe. " The worrisome physical exam findings include gangrenous right first and third toes " The initial radiographic and laboratory data are worrisome because of leukocytosis, possible UTI and gangrene of right foot. " The chronic co-morbidities include type II DM with peripheral neuropathy.  HTN, advanced dementia.   * I certify that at the point of admission it is my clinical judgment that the patient will require inpatient hospital care spanning beyond 2 midnights from the point of admission due to high intensity of service, high risk for further deterioration and high frequency of surveillance required.Vernell Leep MD [facility]  To contact the attending provider between 7A-7P or the covering provider during after hours 7P-7A, please log into the web site [email or web address] and access using universal [place] password for that web site. If you do not have the password, please call the hospital operator.  08/19/2018, 7:13 PM

## 2018-08-20 ENCOUNTER — Encounter (HOSPITAL_COMMUNITY): Payer: Self-pay

## 2018-08-20 ENCOUNTER — Other Ambulatory Visit: Payer: Self-pay

## 2018-08-20 DIAGNOSIS — I96 Gangrene, not elsewhere classified: Secondary | ICD-10-CM

## 2018-08-20 DIAGNOSIS — Z7189 Other specified counseling: Secondary | ICD-10-CM

## 2018-08-20 DIAGNOSIS — Z515 Encounter for palliative care: Secondary | ICD-10-CM

## 2018-08-20 DIAGNOSIS — I739 Peripheral vascular disease, unspecified: Secondary | ICD-10-CM

## 2018-08-20 DIAGNOSIS — E119 Type 2 diabetes mellitus without complications: Secondary | ICD-10-CM

## 2018-08-20 LAB — GLUCOSE, CAPILLARY
Glucose-Capillary: 136 mg/dL — ABNORMAL HIGH (ref 70–99)
Glucose-Capillary: 132 mg/dL — ABNORMAL HIGH (ref 70–99)
Glucose-Capillary: 173 mg/dL — ABNORMAL HIGH (ref 70–99)
Glucose-Capillary: 194 mg/dL — ABNORMAL HIGH (ref 70–99)
Glucose-Capillary: 185 mg/dL — ABNORMAL HIGH (ref 70–99)

## 2018-08-20 NOTE — Progress Notes (Signed)
PROGRESS NOTE   Raymond Hunter  SJG:[id number]    DOB: May 28, 1946    DOA: 08/19/2018  PCP: Maryella Shivers, MD   I have briefly reviewed patients previous medical records in [facility].  Brief Narrative:  Raymond Hunter is a 73 year old male, resident of [facility] in [place], [place], bedbound for several weeks now, prior to that mobile with the help of a wheelchair, initially transferred from SNF to [facility] on 08/18/2018 due to 2 black right foot toes with surrounding redness.  He was evaluated to have sepsis due to potential right diabetic foot infection and possible UTI.  He was treated per sepsis protocol with IV fluids and IV vancomycin and Zosyn.  Due to concern for critical right limb ischemia and need for further evaluation by vascular surgery at a tertiary center, he was transferred to [facility] on 1/25.  Vascular surgery consulted.  Improving.   Assessment & Plan:   Principal Problem:   Gangrene of toe of right foot ([place]) Active Problems:   Cellulitis of foot, right   Diabetic infection of right foot ([place])   Sepsis ([place])   Alzheimer's dementia ([place])   Type 2 diabetes mellitus with peripheral neuropathy ([place])   Essential hypertension   Urinary retention   Neurogenic bladder   Pressure injury of skin    1. PAD with gangrene of right foot (first and third toes and possibly second toe): Vascular surgery/Dr. Donzetta Matters has met with daughter this morning and it appears that right AKA has been discussed.?  For 1/29.  His note is pending. 2. Diabetic right foot infection/cellulitis: Complicating PAD and gangrene.  IV vancomycin and Zosyn.  He could have underlying osteomyelitis.  Improving.  Need to follow-up on cultures from [facility]. 3. Sepsis: Patient presented to [facility] with sepsis where he met sepsis criteria and was treated per protocol with IV fluids and antibiotics.  Sepsis physiology has resolved.  Continue IV vancomycin and  Zosyn.  Sepsis likely due to diabetic right foot infection/cellulitis complicating gangrene and possible UTI.  Discontinued IV fluids.  Lactate normalized. 4. BPH/neurogenic bladder/urinary retention/possible CAUTI: Patient used to have in and out catheterization done 3 times daily for a couple of years until recently.  Now has indwelling catheter for the last week.  Urinary catheter was reportedly changed at [facility] on day of admission.  Patient unable to provide history of dysuria.  IV Zosyn should cover UTI. 5. Type II DM with peripheral neuropathy: We will place patient on reduced dose of twice daily Levemir and add NovoLog SSI.  Monitor CBGs closely and adjust insulins as needed.  CBGs reasonably controlled inpatient. 6. Essential hypertension: Controlled.  Continue lisinopril. 7. Advanced Alzheimer's dementia: Continue Aricept, Namenda and Depakote.  Mental status likely at baseline. 8. Evaded lactate: Noted at OSH.    Resolved. 9. Leukocytosis: Secondary to sepsis.  Improving. 10. Adult failure to thrive: Multifactorial secondary to advanced dementia complicated by multiple severe significant medical problems and advanced age.  Daughter requests palliative care consultation for goals of care.   DVT prophylaxis: Lovenox Code Status: DNR Family Communication: Discussed in detail with patient's daughter at bedside. Disposition: DC back to SNF pending clinical improvement.   Consultants:  Vascular surgery  Procedures:  Has indwelling Foley catheter which was changed at [facility] on 1/25  Antimicrobials:  IV vancomycin and Zosyn   Subjective: Patient alert but confused.  Unable to provide much history.  As per RN and daughter at  bedside, no acute issues noted.  Ate breakfast this morning, needs assistance.  No pain reported.  ROS: Above, otherwise negative.  Objective:  Vitals:   08/19/18 2207 08/19/18 2300 08/20/18 0500 08/20/18 0747  BP: 93/76 (!) 100/54 (!)  110/58 (!) 93/47  Pulse:  63 67 62  Resp:  14  16  Temp:  98.5 F (36.9 C) 98.5 F (36.9 C) 98.5 F (36.9 C)  TempSrc:  Oral Oral Oral  SpO2:  97% 99% 97%  Weight:      Height:        Examination:  General exam: Pleasant elderly male, moderately built and nourished lying comfortably propped up in bed without distress.  Oral mucosa moist. Respiratory system: Clear to auscultation. Respiratory effort normal. Cardiovascular system: S1 & S2 heard, RRR. No JVD, murmurs, rubs, gallops or clicks. No pedal edema. Gastrointestinal system: Abdomen is nondistended, soft and nontender. No organomegaly or masses felt. Normal bowel sounds heard. Central nervous system: Alert and oriented only to self. No focal neurological deficits. Extremities: Symmetric 5 x 5 power. Skin: Bilateral femoral and popliteal pulses symmetrically felt.  Unable to appreciate bilateral dorsalis pedis and posterior tibial pulses.  Both feet are warm to touch.  Right foot first and second toe with black discoloration/gangrene.  Minimal erythema that he had yesterday seems to have improved/resolved. No fluctuance or purulent drainage.  No foul smell.  Please see pictures below from 08/19/2018 for details. Psychiatry: Judgement and insight appear normal. Mood & affect appropriate.  GU: Foley catheter present.        Data Reviewed: I have personally reviewed following labs and imaging studies  CBC: Recent Labs  Lab 08/19/18 1820  WBC 15.1*  HGB 12.4*  HCT 38.2*  MCV 92.5  PLT 546   Basic Metabolic Panel: Recent Labs  Lab 08/19/18 1820  NA 132*  K 4.4  CL 96*  CO2 23  GLUCOSE 237*  BUN 14  CREATININE 0.87  CALCIUM 8.6*   Liver Function Tests: Recent Labs  Lab 08/19/18 1820  AST 29  ALT 19  ALKPHOS 57  BILITOT 0.9  PROT 6.4*  ALBUMIN 2.6*   CBG: Recent Labs  Lab 08/19/18 2208 08/20/18 0500 08/20/18 0745  GLUCAP 206* 136* 132*    Recent Results (from the past 240 hour(s))  MRSA PCR  Screening     Status: None   Collection Time: 08/19/18  5:52 PM  Result Value Ref Range Status   MRSA by PCR NEGATIVE NEGATIVE Final    Comment:        The GeneXpert MRSA Assay (FDA approved for NASAL specimens only), is one component of a comprehensive MRSA colonization surveillance program. It is not intended to diagnose MRSA infection nor to guide or monitor treatment for MRSA infections. Performed at [facility], [place] [address] [place], [place] [address]          Radiology Studies: No results found.      Scheduled Meds: . aspirin  81 mg Oral Daily  . cholecalciferol  2,000 Units Oral Daily  . divalproex  125 mg Oral Q8H  . docusate sodium  100 mg Oral BID  . donepezil  10 mg Oral QHS  . enoxaparin (LOVENOX) injection  40 mg Subcutaneous Q24H  . insulin aspart  0-5 Units Subcutaneous QHS  . insulin aspart  0-9 Units Subcutaneous TID WC  . insulin detemir  25 Units Subcutaneous BID  . lisinopril  5 mg Oral Daily  . memantine  10 mg Oral BID  . traZODone  25 mg Oral QHS   Continuous Infusions: . piperacillin-tazobactam (ZOSYN)  IV 3.375 g (08/20/18 0510)  . vancomycin 1,500 mg (08/20/18 0819)     LOS: 1 day     Vernell Leep, MD, FACP, [facility]. [facility]  To contact the attending provider between 7A-7P or the covering provider during after hours 7P-7A, please log into the web site [email or web address] and access using universal [place] password for that web site. If you do not have the password, please call the hospital operator.  08/20/2018, 10:44 AM

## 2018-08-20 NOTE — Evaluation (Signed)
Physical Therapy Evaluation Patient Details Name: Raymond Hunter MRN: [id number] DOB: Aug 06, 1945 Today's Date: 08/20/2018   History of Present Illness  73 y.o. male admitted from residential SNF with black right foot toes with surrounding redness. PMH of advanced dementia with behavioral abnormalities, DM 2/IDDM with peripheral neuropathy, HTN, BPH, neurogenic bladder    Clinical Impression  Pt admitted with above diagnosis. Pt currently with functional limitations due to the deficits listed below (see PT Problem List). PTA, pt living at SNF working with PT on and off, has not stood in weeks per daughter. Pt with advanced dementia and not compliant with exam today, at times verbally abusive. Has strength to move extremities but unable to follow cues or commands. Total A for bed mobility in large part to cognition. Daughter interested in speaking with palliative care. Will need Sf  Pt will benefit from skilled PT to increase their independence and safety with mobility to allow discharge to the venue listed below.       Follow Up Recommendations SNF    Equipment Recommendations       Recommendations for Other Services   Palliative Care    Precautions / Restrictions Precautions Precautions: Fall Restrictions Weight Bearing Restrictions: No      Mobility  Bed Mobility Overal bed mobility: Needs Assistance Bed Mobility: Rolling Rolling: Total assist            Transfers                    Ambulation/Gait                Stairs            Wheelchair Mobility    Modified Rankin (Stroke Patients Only)       Balance                                             Pertinent Vitals/Pain Pain Assessment: Faces Faces Pain Scale: Hurts a little bit Pain Location: generalized Pain Descriptors / Indicators: Grimacing    Home Living Family/patient expects to be discharged to:: Skilled nursing facility                      Prior  Function Level of Independence: Needs assistance         Comments: per daugther patient bed bound for weeks. unable to stand pivot into w/c for some time now     Hand Dominance        Extremity/Trunk Assessment   Upper Extremity Assessment Upper Extremity Assessment: Difficult to assess due to impaired cognition;Defer to OT evaluation    Lower Extremity Assessment Lower Extremity Assessment: Difficult to assess due to impaired cognition(WNL PROM, refusing AROM)       Communication      Cognition Arousal/Alertness: Lethargic Behavior During Therapy: Flat affect Overall Cognitive Status: History of cognitive impairments - at baseline                                 General Comments: not AO, not following commands, jsut woke up so daugther belives more flat. has tendency to swear alot at providers.       General Comments      Exercises     Assessment/Plan    PT Assessment Patient  needs continued PT services  PT Problem List Decreased strength       PT Treatment Interventions DME instruction;Functional mobility training;Therapeutic exercise;Therapeutic activities    PT Goals (Current goals can be found in the Care Plan section)  Acute Rehab PT Goals Patient Stated Goal: none PT Goal Formulation: With patient Time For Goal Achievement: 09/03/18 Potential to Achieve Goals: Poor    Frequency Min 2X/week   Barriers to discharge        Co-evaluation               AM-PAC PT "6 Clicks" Mobility  Outcome Measure Help needed turning from your back to your side while in a flat bed without using bedrails?: Total Help needed moving from lying on your back to sitting on the side of a flat bed without using bedrails?: Total Help needed moving to and from a bed to a chair (including a wheelchair)?: Total Help needed standing up from a chair using your arms (e.g., wheelchair or bedside chair)?: Total Help needed to walk in hospital room?:  Total Help needed climbing 3-5 steps with a railing? : Total 6 Click Score: 6    End of Session   Activity Tolerance: Treatment limited secondary to agitation Patient left: in bed;with call bell/phone within reach;with nursing/sitter in room;with family/visitor present Nurse Communication: Mobility status PT Visit Diagnosis: Unsteadiness on feet (R26.81)    Time: 0930-1000 PT Time Calculation (min) (ACUTE ONLY): 30 min   Charges:   PT Evaluation $PT Eval Moderate Complexity: 1 Mod         Etta GrandchildSean Bobby Ragan, PT, DPT Acute Rehabilitation Services Pager: [phone] Office: [phone]    Etta GrandchildSean Vincent Streater 08/20/2018, 11:02 AM

## 2018-08-20 NOTE — Consult Note (Signed)
Hospital Consult    Reason for Consult:  Gangrene of toes Referring Physician:  Dr. Waymon Amato MRN #:  [id number]  History of Present Illness: This is a 73 y.o. male resident of [facility] is nonambulatory and has been having progressive gangrenous changes to the toes on his right foot.  Recently was transferred to [facility] evaluated for sepsis with streaking erythema on his right foot.  He has been started on antibiotics.  He is now transferred here for vascular surgical intervention versus evaluation.  Risk factors include hypertension, diabetes and he was a former smoker.  All history obtained from chart and daughter as patient has very advanced dementia.  This has been going on for at least 1 but likely 2 weeks.  Currently daughter says looking much better after antibiotics and admission to the hospital.  PMH: Type 2 diabetes, hypertension, BPH, neurogenic bladder, PAD, advanced dementia  Surgery: Multiple previous back surgeries  Social history: Currently resident of SNF, he is a former smoker  Family history: Hypertension, diabetes, back problems   Prior to Admission medications   Medication Sig Start Date End Date Taking? Authorizing Provider  aspirin 81 MG chewable tablet Chew 81 mg by mouth daily.   Yes [provider]  cholecalciferol (VITAMIN D3) 25 MCG (1000 UT) tablet Take 2,000 Units by mouth daily.   Yes [provider]  divalproex (DEPAKOTE SPRINKLE) 125 MG capsule Take 125 mg by mouth every 8 (eight) hours.   Yes [provider]  donepezil (ARICEPT) 10 MG tablet Take 10 mg by mouth at bedtime.   Yes [provider]  insulin detemir (LEVEMIR) 100 UNIT/ML injection Inject 50-65 Units into the skin See admin instructions. Inject 65 units daily in the morning at 0600, and inject 50 units in the evening at 2100.   Yes [provider]  lisinopril (PRINIVIL,ZESTRIL) 5 MG tablet Take 5 mg by mouth daily.   Yes [provider]  LORazepam (ATIVAN) 0.5 MG tablet Take 0.5 mg by mouth once. 30 minutes prior to scheduled ultrasound   Yes [provider]  memantine (NAMENDA) 10 MG tablet Take 10 mg by mouth 2 (two) times daily.   Yes [provider]  metFORMIN (GLUCOPHAGE) 1000 MG tablet Take 1,000 mg by mouth 2 (two) times daily with a meal.   Yes [provider]  traZODone (DESYREL) 50 MG tablet Take 25 mg by mouth at bedtime.   Yes [provider]    ROS: Cannot be evaluated given patient's advanced dementia   Physical Examination  Vitals:   08/19/18 2300 08/20/18 0500  BP: (!) 100/54 (!) 110/58  Pulse: 63 67  Resp: 14   Temp: 98.5 F (36.9 C) 98.5 F (36.9 C)  SpO2: 97% 99%   Body mass index is 29.99 kg/m.  General: No acute distress HENT: WNL, normocephalic Pulmonary: normal non-labored breathing Cardiac: Palpable pulses femoral and popliteal bilaterally.  No distal pulses Abdomen: soft, NT/ND, no masses Extremities:    Neurologic: He is awake and alert but not oriented to person place or time   CBC    Component Value Date/Time   WBC 15.1 (H) 08/19/2018 1820   RBC 4.13 (L) 08/19/2018 1820   HGB 12.4 (L) 08/19/2018 1820   HCT 38.2 (L) 08/19/2018 1820   PLT 221 08/19/2018 1820   MCV 92.5 08/19/2018 1820   MCH 30.0 08/19/2018 1820   MCHC 32.5 08/19/2018 1820   RDW 12.8 08/19/2018 1820   LYMPHSABS 2.7 07/24/2007 0420  MONOABS 1.4 (H) 07/24/2007 0420   EOSABS 0.1 07/24/2007 0420   BASOSABS 0.0 07/24/2007 0420    BMET    Component Value Date/Time   NA 132 (L) 08/19/2018 1820   K 4.4 08/19/2018 1820   CL 96 (L) 08/19/2018 1820   CO2 23 08/19/2018 1820   GLUCOSE 237 (H) 08/19/2018 1820   BUN 14 08/19/2018 1820   CREATININE 0.87 08/19/2018 1820   CALCIUM 8.6 (L) 08/19/2018 1820   GFRNONAA >60 08/19/2018 1820   GFRAA >60 08/19/2018 1820    COAGS: No results found for: INR, PROTIME    ASSESSMENT/PLAN: This is a 73 y.o. male  with advanced dementia resident of nursing home that is nonambulatory.  He presents with gangrenous changes to his right toes as demonstrated above.  I have discussed with his daughter the options being endovascular evaluation with possible intervention and amputation of toes only although I do not think this is a prudent pathway given his nonambulatory status.  Other options would be palliative care and possible right above-knee amputation.  This time patient family would like to be evaluated by palliative care.  We will keep above-knee amputation as an option after goals of care are decided.  Thorn Demas C. Randie Heinzain, MD [facility] Office: [phone] Pager: [phone]

## 2018-08-20 NOTE — Consult Note (Signed)
Consultation Note Date: 08/20/2018   Patient Name: Raymond Hunter  DOB: 06-Feb-1946  MRN: [id number]  Age / Sex: 73 y.o., male  PCP: Maryella Shivers, MD Referring Physician: Modena Jansky, MD  Reason for Consultation: Establishing goals of care and Psychosocial/spiritual support  HPI/Patient Profile: 73 y.o. male  with past medical history of advanced dementia, diabetes type 2, hypertension, neurogenic bladder (had been doing in and out caths but now has permanent indwelling Foley catheter), BPH, recurrent UTIs, peripheral arterial disease admitted on 08/19/2018 from skilled nursing facility to [facility] secondary to blackened right great toe and third toe.  He was subsequently transferred to [facility] secondary to critical limb ischemia, consultation with vascular surgeon.  He was admitted with sepsis.  Consult ordered for goals of care  Clinical Assessment and Goals of Care: Patient seen, chart reviewed.  Met with patient's daughter, Raymond Hunter, who is his healthcare power of attorney.  She shares with me that she has 3 other siblings who are also involved in his care but she is the primary spokesperson.  Recognizes her father's advanced illness but sees him as still having some good and bad days.  He has been living in a nursing home for the past 4 years and has adjusted quite well to that. He has been bedbound now for several weeks and  has not experienced distress with loss of mobility.  She recognizes that amputation will not improve his dementia and since he is no longer able to walk she is hopeful that amputation will prolong some his life  in terms of where he is right now.  She understands that there is always the concern of healing and that likely the other leg is suffering from poor circulation as well.  I discussed with her low albumin of 2.6 and explained how this could impact wound healing.   She shows good insight that amputation would not  improve his functional status but she feels that he is comfortable with the quality of life that he currently has and that if amputation can provide another 6 months she feels that it would be beneficial.  I asked her what would things look like for her to feel that amputation was not appropriate and she replied if his clinical status changed, meaning if his doctors felt like he was not a good surgical candidate, would not survive the surgery or if another complication developed before he could have the surgery.  She is hopeful that he will return to the skilled nursing nursing facility where he is lived  for the past 4 years in [place].  She would also like to get hospice support from [facility] while he is in the nursing home going forward.  Patient is unable to speak for himself.  His healthcare proxy would be his daughter, Raymond Hunter at [place]   DNR Would not want to pursue PEG tube or artificial feeding Would like to go forward unless medically contraindicated with  right AKA as per her discussion with Dr. Donzetta Matters.  This is tentatively scheduled for 08/23/2018 Palliative medicine to stay involved, support patient and family through surgery to help transition to community setting with hospice Code Status/Advance Care Planning:  DNR   Palliative Prophylaxis:   Aspiration, Bowel Regimen, Delirium Protocol, Eye Care, Frequent Pain Assessment, Oral Care, Palliative Wound Care and Turn Reposition  Additional Recommendations (Limitations, Scope, Preferences):  Avoid Hospitalization, Minimize Medications, Initiate Comfort Feeding, No Artificial Feeding, No Chemotherapy, No Hemodialysis, No Radiation and No Tracheostomy  Psycho-social/Spiritual:   Desire for further Chaplaincy support:no  Additional Recommendations: Referral to Community Resources   Prognosis:   Unable to  determine  Discharge Planning: [place] with Hospice      Primary Diagnoses: Present on Admission: . Gangrene of toe of right foot ([place]) . Cellulitis of foot, right . Diabetic infection of right foot ([place]) . Sepsis ([place]) . Alzheimer's dementia ([place]) . Type 2 diabetes mellitus with peripheral neuropathy (HCC) . Essential hypertension . Urinary retention . Neurogenic bladder   I have reviewed the medical record, interviewed the patient and family, and examined the patient. The following aspects are pertinent.  History reviewed. No pertinent past medical history. Social History   Socioeconomic History  . Marital status: Widowed    Spouse name: Not on file  . Number of children: Not on file  . Years of education: Not on file  . Highest education level: Not on file  Occupational History  . Not on file  Social Needs  . Financial resource strain: Patient refused  . Food insecurity:    Worry: Patient refused    Inability: Patient refused  . Transportation needs:    Medical: Patient refused    Non-medical: Patient refused  Tobacco Use  . Smoking status: Former Smoker    Types: Cigarettes  . Smokeless tobacco: Never Used  Substance and Sexual Activity  . Alcohol use: Not Currently    Frequency: Never  . Drug use: Not Currently  . Sexual activity: Not Currently  Lifestyle  . Physical activity:    Days per week: Patient refused    Minutes per session: Patient refused  . Stress: Patient refused  Relationships  . Social connections:    Talks on phone: Patient refused    Gets together: Patient refused    Attends religious service: Patient refused    Active member of club or organization: Patient refused    Attends meetings of clubs or organizations: Patient refused    Relationship status: Patient refused  Other Topics Concern  . Not on file  Social History Narrative  . Not on file   History reviewed. No pertinent family history. Scheduled Meds: .  aspirin  81 mg Oral Daily  . cholecalciferol  2,000 Units Oral Daily  . divalproex  125 mg Oral Q8H  . docusate sodium  100 mg Oral BID  . donepezil  10 mg Oral QHS  . enoxaparin (LOVENOX) injection  40 mg Subcutaneous Q24H  . insulin aspart  0-5 Units Subcutaneous QHS  . insulin aspart  0-9 Units Subcutaneous TID WC  . insulin detemir  25 Units Subcutaneous BID  . lisinopril  5 mg Oral Daily  . memantine  10 mg Oral BID  . traZODone  25 mg Oral QHS   Continuous Infusions: . piperacillin-tazobactam (ZOSYN)  IV 3.375 g (08/20/18 1224)  . vancomycin 1,500 mg (08/20/18 0819)   PRN Meds:.acetaminophen **OR** acetaminophen, albuterol, traMADol Medications Prior to Admission:  Prior  to Admission medications   Medication Sig Start Date End Date Taking? Authorizing Provider  aspirin 81 MG chewable tablet Chew 81 mg by mouth daily.   Yes [provider]  cholecalciferol (VITAMIN D3) 25 MCG (1000 UT) tablet Take 2,000 Units by mouth daily.   Yes [provider]  divalproex (DEPAKOTE SPRINKLE) 125 MG capsule Take 125 mg by mouth every 8 (eight) hours.   Yes [provider]  donepezil (ARICEPT) 10 MG tablet Take 10 mg by mouth at bedtime.   Yes [provider]  insulin detemir (LEVEMIR) 100 UNIT/ML injection Inject 50-65 Units into the skin See admin instructions. Inject 65 units daily in the morning at 0600, and inject 50 units in the evening at 2100.   Yes [provider]  lisinopril (PRINIVIL,ZESTRIL) 5 MG tablet Take 5 mg by mouth daily.   Yes [provider]  LORazepam (ATIVAN) 0.5 MG tablet Take 0.5 mg by mouth once. 30 minutes prior to scheduled ultrasound   Yes [provider]  memantine (NAMENDA) 10 MG tablet Take 10 mg by mouth 2 (two) times daily.   Yes [provider]  metFORMIN (GLUCOPHAGE) 1000 MG tablet Take 1,000 mg by mouth 2 (two) times daily with a meal.   Yes [provider]  traZODone (DESYREL) 50  MG tablet Take 25 mg by mouth at bedtime.   Yes [provider]   Not on File Review of Systems  Unable to perform ROS: Dementia    Physical Exam Vitals signs and nursing note reviewed.  Constitutional:      Appearance: Normal appearance.     Comments: Patient is somnolent but does not appear to be in any acute distress  HENT:     Head: Normocephalic and atraumatic.  Cardiovascular:     Rate and Rhythm: Normal rate.  Pulmonary:     Effort: Pulmonary effort is normal.  Musculoskeletal:     Comments: Right foot: Blackened great toe, third toe with surrounding edema, erythema  Neurological:     Comments: Unable to test  Psychiatric:     Comments: Unable to test     Vital Signs: BP 116/61 (BP Location: Right Arm)   Pulse 63   Temp 98.2 F (36.8 C) (Oral)   Resp (!) 30   Ht _0  (1.778 m) Comment: pt daughter stated  Wt 94.8 kg   SpO2 97%   BMI 29.99 kg/m  Pain Scale: Faces POSS *See Group Information*: 1-Acceptable,Awake and alert Pain Score: 0-No pain   SpO2: SpO2: 97 % O2 Device:SpO2: 97 % O2 Flow Rate: .   IO: Intake/output summary:   Intake/Output Summary (Last 24 hours) at 08/20/2018 1645 Last data filed at 08/20/2018 1000 Gross per 24 hour  Intake 796.95 ml  Output 800 ml  Net -3.05 ml    LBM:   Baseline Weight: Weight: 94.8 kg Most recent weight: Weight: 94.8 kg     Palliative Assessment/Data:   Flowsheet Rows     Most Recent Value  Intake Tab  Referral Department  Hospitalist  Unit at Time of Referral  Med/Surg Unit  Palliative Care Primary Diagnosis  Sepsis/Infectious Disease  Date Notified  08/20/18  Reason for referral  Clarify Goals of Care, Psychosocial or Spiritual support, Counsel Regarding Hospice  Date of Admission  08/19/18  Date first seen by Palliative Care  08/20/18  # of days Palliative referral response time  0 Day(s)  # of days IP prior to Palliative referral  1  Clinical Assessment  Palliative Performance Scale  Score  30%  Pain Max last 24 hours  Not able to report  Pain Min Last 24 hours  Not able to report  Dyspnea Max Last 24 Hours  Not able to report  Dyspnea Min Last 24 hours  Not able to report  Nausea Max Last 24 Hours  Not able to report  Nausea Min Last 24 Hours  Not able to report  Anxiety Max Last 24 Hours  Not able to report  Anxiety Min Last 24 Hours  Not able to report  Other Max Last 24 Hours  Not able to report  Psychosocial & Spiritual Assessment  Palliative Care Outcomes  Patient/Family meeting held?  Yes  Who was at the meeting?  dtr Ladona Horns  Patient/Family wishes: Interventions discontinued/not started   Mechanical Ventilation, Hemodialysis, PEG, Trach, Vasopressors, NIPPV, Tube feedings/TPN  Palliative Care follow-up planned  Yes, Facility      Time In: 1500 Time Out: 1615 Time Total: 75 min Greater than 50%  of this time was spent counseling and coordinating care related to the above assessment and plan.  Signed by: Dory Horn, NP   Please contact Palliative Medicine Team phone at [phone] for questions and concerns.  For individual provider: See Shea Evans

## 2018-08-20 NOTE — Progress Notes (Signed)
Pt daughter called this morning to ask how pt behavior was during night. Pt daughter asked for pt to get bacon. Informed pt family would let oncoming staff know. Pt family stated "well, do they cook the bacon in the hallway". Asked family to clarify. Pt daughter stated they are trying to be proactive. Informed family would inform staff that family would like pt to get bacon.

## 2018-08-21 ENCOUNTER — Inpatient Hospital Stay (HOSPITAL_COMMUNITY): Payer: Medicare Other

## 2018-08-21 LAB — CBC
HCT: 36.7 % — ABNORMAL LOW (ref 39.0–52.0)
Hemoglobin: 12.2 g/dL — ABNORMAL LOW (ref 13.0–17.0)
MCH: 30.3 pg (ref 26.0–34.0)
MCHC: 33.2 g/dL (ref 30.0–36.0)
MCV: 91.3 fL (ref 80.0–100.0)
PLATELETS: 245 10*3/uL (ref 150–400)
RBC: 4.02 MIL/uL — ABNORMAL LOW (ref 4.22–5.81)
RDW: 12.7 % (ref 11.5–15.5)
WBC: 14.4 10*3/uL — ABNORMAL HIGH (ref 4.0–10.5)
nRBC: 0 % (ref 0.0–0.2)

## 2018-08-21 LAB — GLUCOSE, CAPILLARY
GLUCOSE-CAPILLARY: 153 mg/dL — AB (ref 70–99)
Glucose-Capillary: 112 mg/dL — ABNORMAL HIGH (ref 70–99)
Glucose-Capillary: 161 mg/dL — ABNORMAL HIGH (ref 70–99)
Glucose-Capillary: 195 mg/dL — ABNORMAL HIGH (ref 70–99)

## 2018-08-21 MED ORDER — GLUCERNA SHAKE PO LIQD
237.0000 mL | Freq: Three times a day (TID) | ORAL | Status: DC
  Administered 2018-08-21 – 2018-08-26 (×12): 237 mL via ORAL
  Filled 2018-08-21 (×2): qty 237

## 2018-08-21 MED ORDER — VANCOMYCIN HCL IN DEXTROSE 1-5 GM/200ML-% IV SOLN
1000.0000 mg | Freq: Two times a day (BID) | INTRAVENOUS | Status: DC
  Administered 2018-08-21 (×2): 1000 mg via INTRAVENOUS
  Filled 2018-08-21 (×3): qty 200

## 2018-08-21 MED ORDER — POLYETHYLENE GLYCOL 3350 17 G PO PACK
17.0000 g | PACK | Freq: Every day | ORAL | Status: DC
  Administered 2018-08-21 – 2018-08-26 (×6): 17 g via ORAL
  Filled 2018-08-21 (×7): qty 1

## 2018-08-21 MED ORDER — BISACODYL 10 MG RE SUPP
10.0000 mg | Freq: Every day | RECTAL | Status: DC | PRN
  Filled 2018-08-21: qty 1

## 2018-08-21 MED ORDER — SENNA 8.6 MG PO TABS
2.0000 | ORAL_TABLET | Freq: Every day | ORAL | Status: DC
  Administered 2018-08-21 – 2018-08-26 (×6): 17.2 mg via ORAL
  Filled 2018-08-21 (×6): qty 2

## 2018-08-21 MED ORDER — PRO-STAT SUGAR FREE PO LIQD
30.0000 mL | Freq: Two times a day (BID) | ORAL | Status: DC
  Administered 2018-08-21 – 2018-08-26 (×11): 30 mL via ORAL
  Filled 2018-08-21 (×12): qty 30

## 2018-08-21 MED ORDER — LORAZEPAM 2 MG/ML IJ SOLN
1.0000 mg | Freq: Once | INTRAMUSCULAR | Status: AC | PRN
  Administered 2018-08-21: 1 mg via INTRAVENOUS
  Filled 2018-08-21: qty 1

## 2018-08-21 NOTE — Progress Notes (Signed)
PROGRESS NOTE   Raymond Hunter  IEP:[id number]    DOB: Jun 01, 1946    DOA: 08/19/2018  PCP: Maryella Shivers, MD   I have briefly reviewed patients previous medical records in [facility].  Brief Narrative:  Raymond Hunter is a 73 year old male, [facility] [place], [place], bedbound for several weeks now, prior to that mobile with the help of a wheelchair, initially transferred from SNF to [facility] on 08/18/2018 due to 2 black right foot toes with surrounding redness.  He was evaluated to have sepsis due to potential right diabetic foot infection and possible UTI.  He was treated per sepsis protocol with IV fluids and IV vancomycin and Zosyn.  Due to concern for critical right limb ischemia and need for further evaluation by vascular surgery at a tertiary center, he was transferred to [facility] on 1/25.  Vascular surgery consulted.  Possibly for right AKA later this week.  PMT on board.   Assessment & Plan:   Principal Problem:   Gangrene of toe of right foot ([place]) Active Problems:   Cellulitis of foot, right   Diabetic infection of right foot ([place])   Sepsis ([place])   Alzheimer's dementia ([place])   Type 2 diabetes mellitus with peripheral neuropathy ([place])   Essential hypertension   Urinary retention   Neurogenic bladder   Pressure injury of skin   Goals of care, counseling/discussion   Palliative care by specialist    1. PAD with gangrene of right foot (first and third toes and possibly second toe): Vascular surgery/Dr. Donzetta Matters  input appreciated and he was awaiting PMT input.  As per daughter's discussion with PMD, it appears that she wishes to proceed with right AKA. 2. Diabetic right foot infection/cellulitis: Complicating PAD and gangrene.  IV vancomycin and Zosyn.  He could have underlying osteomyelitis.  Improving.  Need to follow-up on cultures from [facility].  Management as noted above. 3. Sepsis: Patient presented to [facility] with sepsis where he met sepsis criteria and was treated per protocol with IV fluids and antibiotics.  Sepsis physiology has resolved.  Continue IV vancomycin and Zosyn.  Sepsis likely due to diabetic right foot infection/cellulitis complicating gangrene and possible UTI.  Discontinued IV fluids.  Lactate normalized. 4. E. coli acute CAUTI versus asymptomatic bacteriuria: Urine culture from [facility] showed >100 K of E. coli, pansensitive and insignificant colonies of Proteus.  Already on Zosyn. 5. BPH/neurogenic bladder/urinary retention/possible CAUTI: Patient used to have in and out catheterization done 3 times daily for a couple of years until recently.  Now has indwelling catheter for the last week.  Urinary catheter was reportedly changed at [facility] on day of admission.  Patient unable to provide history of dysuria.  IV Zosyn. 6. Type II DM with peripheral neuropathy: We will place patient on reduced dose of twice daily Levemir and add NovoLog SSI.  Monitor CBGs closely and adjust insulins as needed.  CBGs reasonably controlled inpatient.  No changes. 7. Essential hypertension: Controlled.  Continue lisinopril. 8. Advanced Alzheimer's dementia: Continue Aricept, Namenda and Depakote.  Mental status likely at baseline. 9. Evaded lactate: Noted at OSH.    Resolved. 10. Leukocytosis: Secondary to sepsis.  Improving. 11. Adult failure to thrive: Multifactorial secondary to advanced dementia complicated by multiple severe significant medical problems and advanced age.  PMT input 1/26 appreciated: DNR confirmed, no PEG tube or artificial feeding, would like to proceed with right AKA, palliative team will continue to  follow, hospice at discharge.   DVT prophylaxis: Lovenox Code Status: DNR Family Communication: None at bedside this morning. Disposition: DC back to SNF pending clinical improvement.   Consultants:  Vascular surgery  Procedures:  Has indwelling Foley  catheter which was changed at [facility] on 1/25  Antimicrobials:  IV vancomycin and Zosyn   Subjective: Alert but pleasantly confused and not providing any history due to his advanced dementia.  As per RN, no acute issues noted.  ROS: Above, otherwise negative.  Objective:  Vitals:   08/20/18 1553 08/20/18 2257 08/21/18 0500 08/21/18 0759  BP: 116/61 129/60  (!) 114/50  Pulse: 63 69  60  Resp: (!) 30 (!) 26  15  Temp: 98.2 F (36.8 C) 99.6 F (37.6 C)  97.8 F (36.6 C)  TempSrc: Oral Oral    SpO2: 97% 92%  95%  Weight:   94.3 kg   Height:        Examination: No significant change in clinical exam compared to yesterday.  General exam: Pleasant elderly male, moderately built and nourished lying comfortably propped up in bed without distress.  Oral mucosa moist. Respiratory system: Clear to auscultation. Respiratory effort normal. Cardiovascular system: S1 & S2 heard, RRR. No JVD, murmurs, rubs, gallops or clicks. No pedal edema. Gastrointestinal system: Abdomen is nondistended, soft and nontender. No organomegaly or masses felt. Normal bowel sounds heard. Central nervous system: Alert and oriented only to self. No focal neurological deficits. Extremities: Symmetric 5 x 5 power. Skin: Bilateral femoral and popliteal pulses symmetrically felt.  Unable to appreciate bilateral dorsalis pedis and posterior tibial pulses.  Both feet are warm to touch.  Right foot first and second toe with black discoloration/gangrene.  Minimal erythema that he had yesterday seems to have improved/resolved. No fluctuance or purulent drainage.  No foul smell.  Please see pictures below from 08/19/2018 for details. Psychiatry: Judgement and insight appear normal. Mood & affect appropriate.  GU: Foley catheter present.        Data Reviewed: I have personally reviewed following labs and imaging studies  CBC: Recent Labs  Lab 08/19/18 1820 08/21/18 0243  WBC 15.1* 14.4*  HGB 12.4* 12.2*   HCT 38.2* 36.7*  MCV 92.5 91.3  PLT 221 875   Basic Metabolic Panel: Recent Labs  Lab 08/19/18 1820  NA 132*  K 4.4  CL 96*  CO2 23  GLUCOSE 237*  BUN 14  CREATININE 0.87  CALCIUM 8.6*   Liver Function Tests: Recent Labs  Lab 08/19/18 1820  AST 29  ALT 19  ALKPHOS 57  BILITOT 0.9  PROT 6.4*  ALBUMIN 2.6*   CBG: Recent Labs  Lab 08/20/18 0745 08/20/18 1146 08/20/18 1607 08/20/18 2052 08/21/18 0800  GLUCAP 132* 173* 194* 185* 112*    Recent Results (from the past 240 hour(s))  MRSA PCR Screening     Status: None   Collection Time: 08/19/18  5:52 PM  Result Value Ref Range Status   MRSA by PCR NEGATIVE NEGATIVE Final    Comment:        The GeneXpert MRSA Assay (FDA approved for NASAL specimens only), is one component of a comprehensive MRSA colonization surveillance program. It is not intended to diagnose MRSA infection nor to guide or monitor treatment for MRSA infections. Performed at [facility], [place] [address] [place], [place] [address]          Radiology Studies: No results found.      Scheduled Meds: . aspirin  81 mg Oral Daily  . cholecalciferol  2,000 Units Oral Daily  . divalproex  125 mg Oral Q8H  . docusate sodium  100 mg Oral BID  . donepezil  10 mg Oral QHS  . enoxaparin (LOVENOX) injection  40 mg Subcutaneous Q24H  . insulin aspart  0-5 Units Subcutaneous QHS  . insulin aspart  0-9 Units Subcutaneous TID WC  . insulin detemir  25 Units Subcutaneous BID  . lisinopril  5 mg Oral Daily  . memantine  10 mg Oral BID  . traZODone  25 mg Oral QHS   Continuous Infusions: . piperacillin-tazobactam (ZOSYN)  IV 3.375 g (08/21/18 0506)  . vancomycin       LOS: 2 days     Vernell Leep, MD, FACP, [facility]. [facility]  To contact the attending provider between 7A-7P or the covering provider during after hours 7P-7A, please log into the web site [email or web address] and access using universal [place] password for  that web site. If you do not have the password, please call the hospital operator.  08/21/2018, 9:15 AM

## 2018-08-21 NOTE — Clinical Social Work Note (Signed)
Clinical Social Work Assessment  Patient Details  Name: Raymond Hunter MRN: [id number] Date of Birth: [birth year]  Date of referral:  08/21/18               Reason for consult:  Discharge Planning                Permission sought to share information with:  Case Manager Permission granted to share information::  Yes, Verbal Permission Granted  Name::     Water quality scientist::  [facility]  Relationship::     Contact Information:     Housing/Transportation Living arrangements for the past 2 months:  Assisted Dealer of Information:  Adult Children Patient Interpreter Needed:  None Criminal Activity/Legal Involvement Pertinent to Current Situation/Hospitalization:  No - Comment as needed Significant Relationships:  Adult Children Lives with:  Facility Resident Do you feel safe going back to the place where you live?  Yes Need for family participation in patient care:  Yes (Comment)(Daughter HCPOA)  Care giving concerns:    Patient is orientated only to himself. His daughter [name] is health care power of attorney. Patient's daughter would like to explore having her father return to his facility with hospice or palliative to follow.   Social Worker assessment / plan:    CSW called and spoke with the patient's daughter. She is his health care power of attorney. Patient's daughter has spoken with Eduard Roux, NP from Palliative Care Team. They decided together that it would be best for the patient to return to his long term care facility with palliative/hospice to follow.   CSW will need to coordinate with [facility] in [place] to make sure that the patient has the appropriate resources.   Employment status:  Retired Database administrator PT Recommendations:  Skilled Nursing Facility Information / Referral to community resources:  Skilled Nursing Facility  Patient/Family's Response to care:  Patient's daughter is understanding  over her father's current medical condition. She wants what is best for him.   Patient/Family's Understanding of and Emotional Response to Diagnosis, Current Treatment, and Prognosis:  Is accepting of her father's current condition. Would like to explore more with hospice services.   Emotional Assessment Appearance:  Appears stated age Attitude/Demeanor/Rapport:  Unable to Assess Affect (typically observed):  Unable to Assess Orientation:  Oriented to Self Alcohol / Substance use:  Not Applicable Psych involvement (Current and /or in the community):  No (Comment)  Discharge Needs  Concerns to be addressed:  Discharge Planning Concerns Readmission within the last 30 days:  No Current discharge risk:  Dependent with Mobility, Cognitively Impaired Barriers to Discharge:  Continued Medical Work up, [employer] Jazzmine Kleiman, LCSWA 08/21/2018, 1:16 PM

## 2018-08-21 NOTE — [place] FL2 (Signed)
[place] MEDICAID FL2 LEVEL OF CARE SCREENING TOOL     IDENTIFICATION  Patient Name: Raymond Hunter Birthdate: 09-15-1945 Sex: male Admission Date (Current Location): 08/19/2018  [facility] and [place] Number:  Producer, television/film/video and Address:  [facility], [address] [place], [place] [address]      Provider Number: [id number]  Attending Physician Name and Address:  Elease Etienne, MD  Relative Name and Phone Number:  Thornell Sartorius, [phone]    Current Level of Care: Hospital Recommended Level of Care: Skilled Nursing Facility Prior Approval Number: [id number]  Date Approved/Denied: 03/02/13 PASRR Number:    Discharge Plan: SNF    Current Diagnoses: Patient Active Problem List   Diagnosis Date Noted  . Goals of care, counseling/discussion   . Palliative care by specialist   . Gangrene of toe of right foot (HCC) 08/19/2018  . Cellulitis of foot, right 08/19/2018  . Diabetic infection of right foot (HCC) 08/19/2018  . Sepsis (HCC) 08/19/2018  . Alzheimer's dementia (HCC) 08/19/2018  . Type 2 diabetes mellitus with peripheral neuropathy (HCC) 08/19/2018  . Essential hypertension 08/19/2018  . Urinary retention 08/19/2018  . Neurogenic bladder 08/19/2018  . Pressure injury of skin 08/19/2018    Orientation RESPIRATION BLADDER Height & Weight     Self  Normal Incontinent, External catheter Weight: 208 lb (94.3 kg) Height:  5\' 10"  (177.8 cm)(pt daughter stated)  BEHAVIORAL SYMPTOMS/MOOD NEUROLOGICAL BOWEL NUTRITION STATUS      Incontinent Diet(Cardiac/Heart Healthy- needs assistance)  AMBULATORY STATUS COMMUNICATION OF NEEDS Skin   Limited Assist Verbally Bruising, Other (Comment), Skin abrasions(Has MASD on buttocks and groin; open toe ulcer, dressing in place change every 3 days; pressure injurt on buttocks)                       Personal Care Assistance Level of Assistance  Bathing, Feeding, Dressing, Total  care Bathing Assistance: Limited assistance Feeding assistance: Limited assistance Dressing Assistance: Limited assistance Total Care Assistance: Limited assistance   Functional Limitations Info  Sight          SPECIAL CARE FACTORS FREQUENCY  PT (By licensed PT), OT (By licensed OT)     PT Frequency: 5x/wk OT Frequency: 5x/wk            Contractures      Additional Factors Info  Code Status, Allergies, Insulin Sliding Scale Code Status Info: DNR Allergies Info: Not on file   Insulin Sliding Scale Info: insulin aspart novolog 0-5 units at bedtime; insulin aspart novolog 0-9 units 3x daily w/meals; insulin detemir (levemir) 25 units 2x daily       Current Medications (08/21/2018):  This is the current hospital active medication list Current Facility-Administered Medications  Medication Dose Route Frequency Provider Last Rate Last Dose  . acetaminophen (TYLENOL) tablet 650 mg  650 mg Oral Q6H PRN Hongalgi, Maximino Greenland, MD       Or  . acetaminophen (TYLENOL) suppository 650 mg  650 mg Rectal Q6H PRN Hongalgi, Anand D, MD      . albuterol (PROVENTIL) (2.5 MG/3ML) 0.083% nebulizer solution 2.5 mg  2.5 mg Nebulization Q2H PRN Hongalgi, Anand D, MD      . aspirin chewable tablet 81 mg  81 mg Oral Daily Elease Etienne, MD   81 mg at 08/21/18 7510  . cholecalciferol (VITAMIN D3) tablet 2,000 Units  2,000 Units Oral Daily Hongalgi, Maximino Greenland, MD   2,000 Units  at 08/21/18 0952  . divalproex (DEPAKOTE SPRINKLE) capsule 125 mg  125 mg Oral Q8H Hongalgi, Maximino GreenlandAnand D, MD   125 mg at 08/21/18 1304  . docusate sodium (COLACE) capsule 100 mg  100 mg Oral BID Elease EtienneHongalgi, Anand D, MD   100 mg at 08/21/18 [id number]  . donepezil (ARICEPT) tablet 10 mg  10 mg Oral QHS Elease EtienneHongalgi, Anand D, MD   10 mg at 08/20/18 2145  . enoxaparin (LOVENOX) injection 40 mg  40 mg Subcutaneous Q24H Elease EtienneHongalgi, Anand D, MD   40 mg at 08/20/18 2008  . feeding supplement (GLUCERNA SHAKE) (GLUCERNA SHAKE) liquid 237 mL  237 mL Oral TID  BM Hongalgi, Maximino GreenlandAnand D, MD   237 mL at 08/21/18 1308  . feeding supplement (PRO-STAT SUGAR FREE 64) liquid 30 mL  30 mL Oral BID Marcellus ScottHongalgi, Anand D, MD   30 mL at 08/21/18 1304  . insulin aspart (novoLOG) injection 0-5 Units  0-5 Units Subcutaneous QHS Elease EtienneHongalgi, Anand D, MD   2 Units at 08/19/18 2224  . insulin aspart (novoLOG) injection 0-9 Units  0-9 Units Subcutaneous TID WC Elease EtienneHongalgi, Anand D, MD   2 Units at 08/21/18 1304  . insulin detemir (LEVEMIR) injection 25 Units  25 Units Subcutaneous BID Elease EtienneHongalgi, Anand D, MD   25 Units at 08/21/18 0950  . lisinopril (PRINIVIL,ZESTRIL) tablet 5 mg  5 mg Oral Daily Elease EtienneHongalgi, Anand D, MD   5 mg at 08/21/18 [id number]  . memantine (NAMENDA) tablet 10 mg  10 mg Oral BID Elease EtienneHongalgi, Anand D, MD   10 mg at 08/21/18 [id number]  . piperacillin-tazobactam (ZOSYN) IVPB 3.375 g  3.375 g Intravenous Q8H Mervyn Gayierce, Catherine A, RPH 12.5 mL/hr at 08/21/18 1305 3.375 g at 08/21/18 1305  . traMADol (ULTRAM) tablet 50 mg  50 mg Oral Q12H PRN Elease EtienneHongalgi, Anand D, MD   50 mg at 08/20/18 0826  . traZODone (DESYREL) tablet 25 mg  25 mg Oral QHS Elease EtienneHongalgi, Anand D, MD   25 mg at 08/20/18 2145  . vancomycin (VANCOCIN) IVPB 1000 mg/200 mL premix  1,000 mg Intravenous Q12H Armandina StammerBatchelder, Nathan J, RPH 200 mL/hr at 08/21/18 0948 1,000 mg at 08/21/18 [id number]     Discharge Medications: Please see discharge summary for a list of discharge medications.  Relevant Imaging Results:  Relevant Lab Results:   Additional Information SSN: [id number]  Nada BoozerCaitlin B Monea Pesantez, LCSWA

## 2018-08-21 NOTE — Progress Notes (Signed)
Initial Nutrition Assessment  DOCUMENTATION CODES:   Not applicable  INTERVENTION:   - Glucerna TID (each provides 220 kcal, 10 g protein) - ProStat BID (each provides 100 kcal, 15 g protein)   NUTRITION DIAGNOSIS:   Increased nutrient needs related to acute illness (wounds) as evidenced by estimated needs.    GOAL:   Patient will meet greater than or equal to 90% of their needs    MONITOR:   PO intake, Supplement acceptance, Labs, Skin  REASON FOR ASSESSMENT:   Malnutrition Screening Tool    ASSESSMENT:   73 yo male, admitted with gangrene of toe of R foot. PMH significant for advanced dementia, insulin-dependent T2DM, frequent UTI. Home meds include vitamin D3, Levemir, Metformin.  Labs: sodium 132, chloride 96, glucose 237, tProtein 6.4, Hgb 12.4 Meds: cholecalciferol, Colace, novolog  Pt resting at time of visit. Unable to answer most questions with more than yes/no answer.  Unable to comment on typical intake. Says yes when asked if he feels hungry.  Denies nausea or vomiting, or difficulty chewing/swallowing. Unsure of last BM. Pt amenable to trying Glucerna TID in any flavor and ProStat BID. Discussed drinking it like a shot of medicine.  Encouraged pt to include protein-rich foods with all meals and snacks, and to eat those foods first if not feeling hungry. Brought him a Glucerna this afternoon.  No wt hx available in chart.  NUTRITION - FOCUSED PHYSICAL EXAM: Deferred at this time - pt will need on follow up visit  Diet Order:  0% x 1 meal on 1/25, per nsg documentation Diet Order            Diet heart healthy/carb modified Room service appropriate? Yes; Fluid consistency: Thin  Diet effective now              EDUCATION NEEDS:   Not appropriate for education at this time  Skin:  Skin Assessment: Skin Integrity Issues: Skin Integrity Issues:: Stage I, Other (Comment) Stage I: buttocks Other: MASD buttocks, groin, perineum; venous statis  ulcer - necrotic R toe  Last BM:     Height:   Ht Readings from Last 1 Encounters:  08/19/18 5\' 10"  (1.778 m)    Weight:   Wt Readings from Last 1 Encounters:  08/21/18 94.3 kg    Ideal Body Weight:  75.5 kg  BMI:  Body mass index is 29.84 kg/m.  Estimated Nutritional Needs:   Kcal:  0315-9458 (20-25 kcal/kg ABW)  Protein:  113-136 gm (1.5-1.8 g/kg IBW)  Fluid:  1 mL/kcal or per MD  Jolaine Artist, MS, RDN, LDN Pager: [phone] Available Mondays and Fridays, 9am-2pm

## 2018-08-21 NOTE — Progress Notes (Signed)
Pharmacy Antibiotic Note  Raymond Hunter is a 73 y.o. male admitted on 08/19/2018 with sepsis from gangrenous toe.  Pharmacy has been consulted for Vancomycin and Zosyn dosing.  On day #3 of abx for diabtic foot infection/gangrene. Possible AKA per Vascular on 1/29. Afebrile, WBC 14.4.  Vancomycin 1000 mg IV Q 12 hrs. Goal AUC 400-550. Expected AUC: 431.7 (BMI 29.7, so not obese) SCr used: 0.87  Plan: Decrease vancomycin to 1g IV q12hr Zosyn 3.375 gm IV q8hr (extended infusion) Will monitor renal function, C&S and vanc levels as appropriate F/U AKA tentatively scheduled for 1/29  Consider stopping Zosyn and switching to cefepime and Flagyl  Height: 5\' 10"  (177.8 cm)(pt daughter stated) Weight: 208 lb (94.3 kg) IBW/kg (Calculated) : 73  Temp (24hrs), Avg:98.5 F (36.9 C), Min:97.8 F (36.6 C), Max:99.6 F (37.6 C)  Recent Labs  Lab 08/19/18 1820 08/19/18 1937 08/19/18 2200 08/21/18 0243  WBC 15.1*  --   --  14.4*  CREATININE 0.87  --   --   --   LATICACIDVEN  --  3.6* 1.8  --     Estimated Creatinine Clearance: 85.9 mL/min (by C-G formula based on SCr of 0.87 mg/dL).    Not on File  Antimicrobials this admission: Vanc 1/25 >>  Zosyn 1/25 >>   Thank you for allowing pharmacy to be a part of this patient's care.  Enzo Bi, PharmD, BCPS, [facility] Clinical Pharmacist Phone number [phone] 08/21/2018 8:38 AM

## 2018-08-21 NOTE — Progress Notes (Signed)
  Progress Note    08/21/2018 5:13 PM * No surgery date entered *  Subjective:  No overnight issues  Vitals:   08/21/18 0759 08/21/18 1646  BP: (!) 114/50 (!) 132/48  Pulse: 60 66  Resp: 15 17  Temp: 97.8 F (36.6 C) 98 F (36.7 C)  SpO2: 95%     Physical Exam: Alert  Non labored respirations Stable right gangrenous changes  CBC    Component Value Date/Time   WBC 14.4 (H) 08/21/2018 0243   RBC 4.02 (L) 08/21/2018 0243   HGB 12.2 (L) 08/21/2018 0243   HCT 36.7 (L) 08/21/2018 0243   PLT 245 08/21/2018 0243   MCV 91.3 08/21/2018 0243   MCH 30.3 08/21/2018 0243   MCHC 33.2 08/21/2018 0243   RDW 12.7 08/21/2018 0243   LYMPHSABS 2.7 07/24/2007 0420   MONOABS 1.4 (H) 07/24/2007 0420   EOSABS 0.1 07/24/2007 0420   BASOSABS 0.0 07/24/2007 0420    BMET    Component Value Date/Time   NA 132 (L) 08/19/2018 1820   K 4.4 08/19/2018 1820   CL 96 (L) 08/19/2018 1820   CO2 23 08/19/2018 1820   GLUCOSE 237 (H) 08/19/2018 1820   BUN 14 08/19/2018 1820   CREATININE 0.87 08/19/2018 1820   CALCIUM 8.6 (L) 08/19/2018 1820   GFRNONAA >60 08/19/2018 1820   GFRAA >60 08/19/2018 1820    INR No results found for: INR   Intake/Output Summary (Last 24 hours) at 08/21/2018 1713 Last data filed at 08/21/2018 1245 Gross per 24 hour  Intake 120 ml  Output 1650 ml  Net -1530 ml     Assessment/plan:  73 y.o. male is here with gangrene of his right great and third toe.  Previously had cellulitis.  Currently on antibiotics.  Plan will be for above-knee amputation tomorrow after discussion with palliative care and the family.  Kamarah Bilotta C. Randie Heinz, MD [facility] Office: [phone] Pager: [phone]  08/21/2018 5:13 PM

## 2018-08-21 NOTE — Progress Notes (Addendum)
Occupational Therapy Evaluation Patient Details Name: DETAVIOUS BAINBRIDGE MRN: [id number] DOB: [birth year] Today's Date: 08/21/2018    History of Present Illness 73 year old male, resident of [facility] in [place], [place], bedbound for several weeks now, prior to that mobile with the help of a wheelchair, initially transferred from SNF to [facility] on 08/18/2018 due to 2 black right foot toes with surrounding redness.  He was evaluated to have sepsis due to potential right diabetic foot infection and possible UTI.  He was treated per sepsis protocol with IV fluids and IV vancomycin and Zosyn.  Due to concern for critical right limb ischemia and need for further evaluation by vascular surgery at a tertiary center, he was transferred to [facility] on 1/25.  Vascular surgery consulted.  Possibly for right AKA later this week.    Clinical Impression   PTA, pt lived in a SNF and has been essentially bed bound for several weeks. Prior to that, pt mobilized at a wc level around the SNF. Pt currently total A for ADL with the exception of feeding self and simple grooming tasks.Inconsistently following commands. Oriented to self only. Recommend Prevalon boot for L foot to reduce pressure on L heel; recommend S with all meals to increase adequate PO intake. Pt will most likely benefit form Air mattress to relieve pressure and reduce risk of pressure injury. Will follow acutely. Pt will need follow up OT at SNF after DC.     Follow Up Recommendations  SNF;Supervision/Assistance - 24 hour    Equipment Recommendations  None recommended by OT    Recommendations for Other Services       Precautions / Restrictions Precautions Precautions: Fall Precaution Comments: at risk for skin breakdown Restrictions Weight Bearing Restrictions: No      Mobility Bed Mobility Overal bed mobility: Needs Assistance Bed Mobility: Rolling Rolling: Total assist         General bed mobility  comments: pt not fo9llowing commands for movment; resistive at times  Transfers                 General transfer comment: NA    Balance                                           ADL either performed or assessed with clinical judgement   ADL Overall ADL's : Needs assistance/impaired Eating/Feeding: Supervision/ safety;Set up;Sitting   Grooming: Oral care;Wash/dry face;Moderate assistance                               Functional mobility during ADLs: Total assistance;+2 for physical assistance General ADL Comments: Pt resistive with any types of movement; Will need +2 A     Vision         Perception     Praxis      Pertinent Vitals/Pain Pain Assessment: Faces Faces Pain Scale: Hurts a little bit Pain Location: generalized Pain Descriptors / Indicators: Grimacing Pain Intervention(s): Limited activity within patient's tolerance     Hand Dominance     Extremity/Trunk Assessment Upper Extremity Assessment Upper Extremity Assessment: Generalized weakness   Lower Extremity Assessment Lower Extremity Assessment: RLE deficits/detail RLE Deficits / Details: Possibility of AKA later this week; Not moving BLE on command       Communication Communication Communication: No difficulties   Cognition  Arousal/Alertness: Awake/alert Behavior During Therapy: Flat affect Overall Cognitive Status: History of cognitive impairments - at baseline                                 General Comments: oriented to person only   General Comments       Exercises     Shoulder Instructions      Home Living Family/patient expects to be discharged to:: Skilled nursing facility                                        Prior Functioning/Environment Level of Independence: Needs assistance  Gait / Transfers Assistance Needed: Has lived at SNF for 4 yeasr; mobile @ wc level; "bedbound for sesveral weeks PTA ADL's /  Homemaking Assistance Needed: Total A with the exception of feeidng            OT Problem List: Decreased strength;Decreased range of motion;Decreased activity tolerance;Impaired balance (sitting and/or standing);Decreased coordination;Decreased cognition;Decreased safety awareness;Decreased knowledge of use of DME or AE;Impaired sensation;Pain;Increased edema      OT Treatment/Interventions: Self-care/ADL training;Therapeutic exercise;Therapeutic activities;Patient/family education    OT Goals(Current goals can be found in the care plan section) Acute Rehab OT Goals Patient Stated Goal: pt unable to state OT Goal Formulation: Patient unable to participate in goal setting Time For Goal Achievement: 09/04/18 Potential to Achieve Goals: Fair  OT Frequency: Min 2X/week(trial)   Barriers to D/C:            Co-evaluation              AM-PAC OT "6 Clicks" Daily Activity     Outcome Measure Help from another person eating meals?: A Lot Help from another person taking care of personal grooming?: A Lot Help from another person toileting, which includes using toliet, bedpan, or urinal?: Total Help from another person bathing (including washing, rinsing, drying)?: Total Help from another person to put on and taking off regular upper body clothing?: Total Help from another person to put on and taking off regular lower body clothing?: Total 6 Click Score: 8   End of Session Nurse Communication: Other (comment)(positioning)  Activity Tolerance: Other (comment)(limited by cognitive status) Patient left: in bed;with call bell/phone within reach;with bed alarm set  OT Visit Diagnosis: Other abnormalities of gait and mobility (R26.89);Muscle weakness (generalized) (M62.81);Other symptoms and signs involving cognitive function;Pain Pain - Right/Left: Right Pain - part of body: Ankle and joints of foot(general dicomsort)                Time: [id number] OT Time Calculation (min): 15  min Charges:  OT General Charges $OT Visit: 1 Visit OT Evaluation $OT Eval Moderate Complexity: 1 Mod  Dezhane Staten, OT/L   Acute OT Clinical Specialist Acute Rehabilitation Services Pager [phone] Office [phone]   [facility] Hospital[facility],HILLARY 08/21/2018, 1:44 PM

## 2018-08-22 ENCOUNTER — Inpatient Hospital Stay (HOSPITAL_COMMUNITY): Payer: Medicare Other | Admitting: Anesthesiology

## 2018-08-22 ENCOUNTER — Encounter (HOSPITAL_COMMUNITY)
Admission: AD | Disposition: A | Discharge status: Skilled Nursing Facility | Payer: Self-pay | Source: Other Acute Inpatient Hospital | Attending: Internal Medicine

## 2018-08-22 DIAGNOSIS — N179 Acute kidney failure, unspecified: Secondary | ICD-10-CM

## 2018-08-22 HISTORY — PX: AMPUTATION: SHX166

## 2018-08-22 LAB — BASIC METABOLIC PANEL
Anion gap: 11 (ref 5–15)
BUN: 18 mg/dL (ref 8–23)
CO2: 23 mmol/L (ref 22–32)
Calcium: 8.5 mg/dL — ABNORMAL LOW (ref 8.9–10.3)
Chloride: 103 mmol/L (ref 98–111)
Creatinine, Ser: 1.94 mg/dL — ABNORMAL HIGH (ref 0.61–1.24)
GFR calc Af Amer: 38 mL/min — ABNORMAL LOW (ref >60)
GFR calc non Af Amer: 33 mL/min — ABNORMAL LOW (ref >60)
Glucose, Bld: 173 mg/dL — ABNORMAL HIGH (ref 70–99)
Potassium: 3.8 mmol/L (ref 3.5–5.1)
Sodium: 137 mmol/L (ref 135–145)

## 2018-08-22 LAB — GLUCOSE, CAPILLARY
Glucose-Capillary: 128 mg/dL — ABNORMAL HIGH (ref 70–99)
Glucose-Capillary: 115 mg/dL — ABNORMAL HIGH (ref 70–99)
Glucose-Capillary: 116 mg/dL — ABNORMAL HIGH (ref 70–99)
Glucose-Capillary: 100 mg/dL — ABNORMAL HIGH (ref 70–99)
Glucose-Capillary: 86 mg/dL (ref 70–99)
Glucose-Capillary: 75 mg/dL (ref 70–99)
Glucose-Capillary: 77 mg/dL (ref 70–99)
Glucose-Capillary: 165 mg/dL — ABNORMAL HIGH (ref 70–99)

## 2018-08-22 LAB — VANCOMYCIN, TROUGH: Vancomycin Tr: 42 ug/mL (ref 15–20)

## 2018-08-22 SURGERY — AMPUTATION, ABOVE KNEE
Anesthesia: General | Laterality: Right

## 2018-08-22 MED ORDER — MAGNESIUM SULFATE 2 GM/50ML IV SOLN
2.0000 g | Freq: Every day | INTRAVENOUS | Status: DC | PRN
  Filled 2018-08-22: qty 50

## 2018-08-22 MED ORDER — PANTOPRAZOLE SODIUM 40 MG PO TBEC
40.0000 mg | DELAYED_RELEASE_TABLET | Freq: Every day | ORAL | Status: DC
  Administered 2018-08-22 – 2018-08-26 (×5): 40 mg via ORAL
  Filled 2018-08-22 (×5): qty 1

## 2018-08-22 MED ORDER — PROPOFOL 10 MG/ML IV BOLUS
INTRAVENOUS | Status: AC
  Filled 2018-08-22: qty 20

## 2018-08-22 MED ORDER — GUAIFENESIN-DM 100-10 MG/5ML PO SYRP: 15.0000 mL | ORAL_SOLUTION | ORAL | Status: DC | PRN

## 2018-08-22 MED ORDER — PHENOL 1.4 % MT LIQD: 1.0000 | OROMUCOSAL | Status: DC | PRN

## 2018-08-22 MED ORDER — VANCOMYCIN VARIABLE DOSE PER UNSTABLE RENAL FUNCTION (PHARMACIST DOSING): Status: DC

## 2018-08-22 MED ORDER — ONDANSETRON HCL 4 MG/2ML IJ SOLN: 4.0000 mg | Freq: Four times a day (QID) | INTRAMUSCULAR | Status: DC | PRN

## 2018-08-22 MED ORDER — 0.9 % SODIUM CHLORIDE (POUR BTL) OPTIME
TOPICAL | Status: DC | PRN
  Administered 2018-08-22: 1000 mL

## 2018-08-22 MED ORDER — HYDRALAZINE HCL 20 MG/ML IJ SOLN: 5.0000 mg | INTRAMUSCULAR | Status: DC | PRN

## 2018-08-22 MED ORDER — ENOXAPARIN SODIUM 40 MG/0.4ML ~~LOC~~ SOLN
40.0000 mg | SUBCUTANEOUS | Status: DC
  Administered 2018-08-23 – 2018-08-26 (×4): 40 mg via SUBCUTANEOUS
  Filled 2018-08-22 (×4): qty 0.4

## 2018-08-22 MED ORDER — MORPHINE SULFATE (PF) 2 MG/ML IV SOLN
1.0000 mg | INTRAVENOUS | Status: DC | PRN
  Administered 2018-08-22 – 2018-08-26 (×6): 1 mg via INTRAVENOUS
  Filled 2018-08-22 (×6): qty 1

## 2018-08-22 MED ORDER — ALUM & MAG HYDROXIDE-SIMETH 200-200-20 MG/5ML PO SUSP: 15.0000 mL | ORAL | Status: DC | PRN

## 2018-08-22 MED ORDER — FENTANYL CITRATE (PF) 100 MCG/2ML IJ SOLN
INTRAMUSCULAR | Status: DC | PRN
  Administered 2018-08-22: 100 ug via INTRAVENOUS

## 2018-08-22 MED ORDER — POTASSIUM CHLORIDE CRYS ER 20 MEQ PO TBCR: 20.0000 meq | EXTENDED_RELEASE_TABLET | Freq: Every day | ORAL | Status: DC | PRN

## 2018-08-22 MED ORDER — PHENYLEPHRINE 40 MCG/ML (10ML) SYRINGE FOR IV PUSH (FOR BLOOD PRESSURE SUPPORT)
PREFILLED_SYRINGE | INTRAVENOUS | Status: AC
  Filled 2018-08-22: qty 10

## 2018-08-22 MED ORDER — ESMOLOL HCL 100 MG/10ML IV SOLN
INTRAVENOUS | Status: AC
  Filled 2018-08-22: qty 10

## 2018-08-22 MED ORDER — FENTANYL CITRATE (PF) 250 MCG/5ML IJ SOLN
INTRAMUSCULAR | Status: AC
  Filled 2018-08-22: qty 5

## 2018-08-22 MED ORDER — SODIUM CHLORIDE 0.9 % IV SOLN
INTRAVENOUS | Status: DC | PRN
  Administered 2018-08-22: 50 ug/min via INTRAVENOUS

## 2018-08-22 MED ORDER — SODIUM CHLORIDE 0.9 % IV SOLN
INTRAVENOUS | Status: DC
  Administered 2018-08-22 (×2): via INTRAVENOUS

## 2018-08-22 MED ORDER — EPHEDRINE 5 MG/ML INJ
INTRAVENOUS | Status: AC
  Filled 2018-08-22: qty 10

## 2018-08-22 MED ORDER — PROTAMINE SULFATE 10 MG/ML IV SOLN
INTRAVENOUS | Status: AC
  Filled 2018-08-22: qty 5

## 2018-08-22 MED ORDER — LABETALOL HCL 5 MG/ML IV SOLN: 10.0000 mg | INTRAVENOUS | Status: DC | PRN

## 2018-08-22 MED ORDER — PROPOFOL 10 MG/ML IV BOLUS
INTRAVENOUS | Status: DC | PRN
  Administered 2018-08-22: 150 mg via INTRAVENOUS

## 2018-08-22 MED ORDER — METOPROLOL TARTRATE 5 MG/5ML IV SOLN: 2.0000 mg | INTRAVENOUS | Status: DC | PRN

## 2018-08-22 MED ORDER — LIDOCAINE HCL (CARDIAC) PF 100 MG/5ML IV SOSY
PREFILLED_SYRINGE | INTRAVENOUS | Status: DC | PRN
  Administered 2018-08-22: 30 mg via INTRAVENOUS

## 2018-08-22 MED ORDER — SUCCINYLCHOLINE CHLORIDE 20 MG/ML IJ SOLN
INTRAMUSCULAR | Status: DC | PRN
  Administered 2018-08-22: 100 mg via INTRAVENOUS

## 2018-08-22 SURGICAL SUPPLY — 55 items
BANDAGE ACE 4X5 VEL STRL LF (GAUZE/BANDAGES/DRESSINGS) ×3 IMPLANT
BANDAGE ACE 6X5 VEL STRL LF (GAUZE/BANDAGES/DRESSINGS) ×3 IMPLANT
BANDAGE ESMARK 6X9 LF (GAUZE/BANDAGES/DRESSINGS) ×1 IMPLANT
BLADE SAGITTAL (BLADE)
BLADE SAGITTAL 25.0X1.19X90 (BLADE) IMPLANT
BLADE SAGITTAL 25.0X1.19X90MM (BLADE)
BLADE SAW GIGLI 510 (BLADE) ×4 IMPLANT
BLADE SAW GIGLI 510MM (BLADE) ×2
BLADE SAW THK.89X75X18XSGTL (BLADE) IMPLANT
BNDG COHESIVE 6X5 TAN STRL LF (GAUZE/BANDAGES/DRESSINGS) ×3 IMPLANT
BNDG ESMARK 6X9 LF (GAUZE/BANDAGES/DRESSINGS) ×3
BNDG GAUZE ELAST 4 BULKY (GAUZE/BANDAGES/DRESSINGS) ×3 IMPLANT
CANISTER SUCT 3000ML PPV (MISCELLANEOUS) ×3 IMPLANT
CLIP VESOCCLUDE MED 6/CT (CLIP) ×3 IMPLANT
COVER SURGICAL LIGHT HANDLE (MISCELLANEOUS) ×3 IMPLANT
COVER WAND RF STERILE (DRAPES) ×3 IMPLANT
CUFF TOURNIQUET SINGLE 24IN (TOURNIQUET CUFF) IMPLANT
CUFF TOURNIQUET SINGLE 34IN LL (TOURNIQUET CUFF) IMPLANT
DRAIN CHANNEL 19F RND (DRAIN) IMPLANT
DRAPE HALF SHEET 40X57 (DRAPES) ×6 IMPLANT
DRAPE ORTHO SPLIT 77X108 STRL (DRAPES) ×4
DRAPE SURG ORHT 6 SPLT 77X108 (DRAPES) ×2 IMPLANT
DRSG ADAPTIC 3X8 NADH LF (GAUZE/BANDAGES/DRESSINGS) ×3 IMPLANT
ELECT CAUTERY BLADE 6.4 (BLADE) ×3 IMPLANT
ELECT REM PT RETURN 9FT ADLT (ELECTROSURGICAL) ×3
ELECTRODE REM PT RTRN 9FT ADLT (ELECTROSURGICAL) ×1 IMPLANT
EVACUATOR SILICONE 100CC (DRAIN) IMPLANT
GAUZE SPONGE 4X4 12PLY STRL (GAUZE/BANDAGES/DRESSINGS) ×6 IMPLANT
GLOVE BIO SURGEON STRL SZ 6.5 (GLOVE) ×2 IMPLANT
GLOVE BIO SURGEON STRL SZ7.5 (GLOVE) ×3 IMPLANT
GLOVE BIO SURGEONS STRL SZ 6.5 (GLOVE) ×1
GLOVE BIOGEL PI IND STRL 7.0 (GLOVE) ×1 IMPLANT
GLOVE BIOGEL PI INDICATOR 7.0 (GLOVE) ×2
GOWN STRL REUS W/ TWL LRG LVL3 (GOWN DISPOSABLE) ×2 IMPLANT
GOWN STRL REUS W/ TWL XL LVL3 (GOWN DISPOSABLE) ×1 IMPLANT
GOWN STRL REUS W/TWL LRG LVL3 (GOWN DISPOSABLE) ×4
GOWN STRL REUS W/TWL XL LVL3 (GOWN DISPOSABLE) ×2
KIT BASIN OR (CUSTOM PROCEDURE TRAY) ×3 IMPLANT
KIT TURNOVER KIT B (KITS) ×3 IMPLANT
NS IRRIG 1000ML POUR BTL (IV SOLUTION) ×3 IMPLANT
PACK GENERAL/GYN (CUSTOM PROCEDURE TRAY) ×3 IMPLANT
PAD ARMBOARD 7.5X6 YLW CONV (MISCELLANEOUS) ×6 IMPLANT
STAPLER VISISTAT 35W (STAPLE) ×6 IMPLANT
STOCKINETTE IMPERVIOUS LG (DRAPES) ×3 IMPLANT
SUT ETHILON 3 0 PS 1 (SUTURE) IMPLANT
SUT SILK 0 TIES 10X30 (SUTURE) ×3 IMPLANT
SUT SILK 2 0 (SUTURE) ×2
SUT SILK 2-0 18XBRD TIE 12 (SUTURE) ×1 IMPLANT
SUT SILK 3 0 (SUTURE)
SUT SILK 3-0 18XBRD TIE 12 (SUTURE) IMPLANT
SUT VIC AB 2-0 CT1 18 (SUTURE) ×9 IMPLANT
TAPE CLOTH SURG 4X10 WHT LF (GAUZE/BANDAGES/DRESSINGS) ×3 IMPLANT
TOWEL GREEN STERILE (TOWEL DISPOSABLE) ×6 IMPLANT
UNDERPAD 30X30 (UNDERPADS AND DIAPERS) ×3 IMPLANT
WATER STERILE IRR 1000ML POUR (IV SOLUTION) ×3 IMPLANT

## 2018-08-22 NOTE — Progress Notes (Signed)
PROGRESS NOTE   Raymond Hunter  DQQ:[id number]    DOB: 01-Dec-1945    DOA: 08/19/2018  PCP: Maryella Shivers, MD   I have briefly reviewed patients previous medical records in [facility].  Brief Narrative:  Raymond Hunter is a 73 year old male, [facility] of [facility] [place], [place], bedbound for several weeks now, prior to that mobile with the help of a wheelchair, initially transferred from SNF to [facility] on 08/18/2018 due to 2 black right foot toes with surrounding redness.  He was evaluated to have sepsis due to potential right diabetic foot infection and possible UTI.  He was treated per sepsis protocol with IV fluids and IV vancomycin and Zosyn.  Due to concern for critical right limb ischemia and need for further evaluation by vascular surgery at a tertiary center, he was transferred to [facility] on 1/25.  Vascular surgery consulted and after PMT input, plan for right AKA on 1/28.  Hospital course complicated by acute kidney injury, possibly related to vancomycin toxicity.   Assessment & Plan:   Principal Problem:   Gangrene of toe of right foot ([place]) Active Problems:   Cellulitis of foot, right   Diabetic infection of right foot ([place])   Sepsis ([place])   Alzheimer's dementia ([place])   Type 2 diabetes mellitus with peripheral neuropathy ([place])   Essential hypertension   Urinary retention   Neurogenic bladder   Pressure injury of skin   Goals of care, counseling/discussion   Palliative care by specialist    1. PAD with gangrene of right foot (first and third toes and possibly second toe): Vascular surgery/Dr. Donzetta Matters  consulted and plans for right AKA on 1/28.  MRA of lower extremities 1/27, results as below appreciated. 2. Diabetic right foot infection/cellulitis: Complicating PAD and gangrene.  IV vancomycin and Zosyn were initially initiated.  He could have underlying osteomyelitis.  Need to follow-up on cultures from [place] to  undergo right AKA this afternoon.  Vancomycin held due to toxicity.  Could check with vascular surgery and possibly discontinue all antibiotics 24 hours to 48 hours post surgery since source of infection would have been removed. 3. Sepsis: Patient presented to [facility] with sepsis where he met sepsis criteria and was treated per protocol with IV fluids and antibiotics.  Sepsis physiology has resolved.  Empirically treated with IV vancomycin and Zosyn.  Sepsis likely due to diabetic right foot infection/cellulitis complicating gangrene and possible UTI.  Discontinued IV fluids.  Lactate normalized.  Antibiotics as noted above. 4. E. coli acute CAUTI versus asymptomatic bacteriuria: Urine culture from [facility] showed >100 K of E. coli, pansensitive and insignificant colonies of Proteus.  Already on Zosyn and completed 3 days thus far. 5. BPH/neurogenic bladder/urinary retention/possible CAUTI: Patient used to have in and out catheterization done 3 times daily for a couple of years until recently.  Now has indwelling catheter for the last week.  Urinary catheter was reportedly changed at [facility] on day of admission.  Patient unable to provide history of dysuria.  IV Zosyn. 6. Type II DM with peripheral neuropathy: Placed on reduced dose of twice daily Levemir and add NovoLog SSI.  Monitor CBGs closely and adjust insulins as needed.  CBGs reasonably controlled inpatient.  Stable without changes. 7. Essential hypertension: Controlled.    Lisinopril discontinued due to acute kidney injury.  Consider PRN IV hydralazine if needed. 8. Advanced Alzheimer's dementia: Continue Aricept, Namenda and Depakote.  Mental status  likely at baseline. 9. Evaded lactate: Noted at OSH.    Resolved. 10. Leukocytosis: Secondary to sepsis.  Improving. 11. Adult failure to thrive: Multifactorial secondary to advanced dementia complicated by multiple severe significant medical problems and advanced age.  PMT  input 1/26 appreciated: DNR confirmed, no PEG tube or artificial feeding, would like to proceed with right AKA, palliative team will continue to follow, hospice at discharge. 12. Acute kidney injury: Creatinine increased from 0.87 on 1/25-1.94 on 1/28.  Vancomycin trough: 42.  Bladder scan shows no urinary retention and he has a Foley catheter.  Acute kidney injury likely related to vancomycin toxicity.  Pharmacy managing vancomycin, currently held.  Discontinued lisinopril.  IV fluids.  Follow BMP daily.  Avoid nephrotoxic's.   DVT prophylaxis: Lovenox Code Status: DNR Family Communication: None at bedside Disposition: DC back to SNF pending clinical improvement.   Consultants:  Vascular surgery  Procedures:  Has indwelling Foley catheter which was changed at [facility] on 1/25  Antimicrobials:  IV vancomycin (held on 1/28) and Zosyn   Subjective: Just stares at you and tracks activity with eyes but not saying much.  As per RN, no acute issues noted.  ROS: Above, otherwise negative.  Objective:  Vitals:   08/21/18 0759 08/21/18 1646 08/21/18 2325 08/22/18 0811  BP: (!) 114/50 (!) 132/48 (!) 118/58 122/64  Pulse: 60 66 69 61  Resp: _0 Temp: 97.8 F (36.6 C) 98 F (36.7 C) 98.3 F (36.8 C) 98.3 F (36.8 C)  TempSrc:   Oral   SpO2: 95%  94% 94%  Weight:      Height:        Examination:   General exam: Pleasant elderly male, moderately built and nourished lying comfortably propped up in bed without distress.  Oral mucosa moist. Respiratory system: Poor inspiratory effort but clear to auscultation.  No increased work of breathing. Cardiovascular system: S1 & S2 heard, RRR. No JVD, murmurs, rubs, gallops or clicks. No pedal edema.  Stable Gastrointestinal system: Abdomen is nondistended, soft and nontender.  No organomegaly or masses appreciated. Normal bowel sounds heard. Central nervous system: Alert but unable to assess orientation.  No focal neurological  deficits appreciated. Extremities: Moves all extremities symmetrically. Skin: Bilateral femoral and popliteal pulses symmetrically felt.  Unable to appreciate bilateral dorsalis pedis and posterior tibial pulses.  Both feet are warm to touch.  Right foot first and third toe with black discoloration/gangrene.  Minimal erythema present on admission has resolved. No fluctuance or purulent drainage.  No foul smell.  Please see pictures below from 08/19/2018 for details. Psychiatry: Judgement and insight impaired. Mood & affect flat.  GU: Foley catheter present.        Data Reviewed: I have personally reviewed following labs and imaging studies  CBC: Recent Labs  Lab 08/19/18 1820 08/21/18 0243  WBC 15.1* 14.4*  HGB 12.4* 12.2*  HCT 38.2* 36.7*  MCV 92.5 91.3  PLT 221 765   Basic Metabolic Panel: Recent Labs  Lab 08/19/18 1820 08/22/18 0227  NA 132* 137  K 4.4 3.8  CL 96* 103  CO2 23 23  GLUCOSE 237* 173*  BUN 14 18  CREATININE 0.87 1.94*  CALCIUM 8.6* 8.5*   Liver Function Tests: Recent Labs  Lab 08/19/18 1820  AST 29  ALT 19  ALKPHOS 57  BILITOT 0.9  PROT 6.4*  ALBUMIN 2.6*   CBG: Recent Labs  Lab 08/21/18 1158 08/21/18 1648 08/21/18 2104 08/22/18 0556 08/22/18 0732  GLUCAP 161* 195* 153* 128* 115*    Recent Results (from the past 240 hour(s))  MRSA PCR Screening     Status: None   Collection Time: 08/19/18  5:52 PM  Result Value Ref Range Status   MRSA by PCR NEGATIVE NEGATIVE Final    Comment:        The GeneXpert MRSA Assay (FDA approved for NASAL specimens only), is one component of a comprehensive MRSA colonization surveillance program. It is not intended to diagnose MRSA infection nor to guide or monitor treatment for MRSA infections. Performed at [facility], [place] [address], [place], [place] [address]          Radiology Studies: Mr Jodene Nam Lower Extremity Left Wo Contrast  Result Date: 08/22/2018 CLINICAL DATA:   Fibromuscular dysplasia of the renal arteries EXAM: MR MRA OF THE RIGHT LOWER EXTREMITY WITHOUT CONTRAST; MR MRA OF THE LEFT LOWER EXTREMITY WITHOUT CONTRAST TECHNIQUE: Multiplanar, multiecho pulse sequences of the bilateral lower extremities were obtained without intravenous contrast. Angiographic images of bilateral lower extremities were obtained using MRA technique without intravenous contrast. COMPARISON:  None. FINDINGS: Noncontrast MR imaging is submitted. Imaging is suboptimal for the evaluation of fibromuscular dysplasia as small webs can easily be missed by this technique. Distal aorta is patent.  IMA is patent. Bilateral common, internal, and external iliac arteries are patent. Right common femoral, superficial femoral, and profunda femoral arteries are patent. Right popliteal artery is patent. The anterior tibial artery is patent to the foot. There is apparent narrowing in the anterior tibial artery within the mid leg. See image 85 of series 12. The peroneal artery is occluded. The posterior tibial artery is patent to the distal leg. Left common femoral and profunda femoral arteries are patent. There is artifact obscuring the proximal left superficial femoral artery. There is poor visualization of the left superficial femoral artery secondary to artifact. The distal left superficial femoral artery is patent. The proximal and mid superficial femoral artery is not adequately evaluated. Left popliteal artery is patent. The anterior tibial artery is patent to the foot. IMPRESSION: The examination is limited in the evaluation of fibromuscular dysplasia as described above. No significant aortic or iliac arterial occlusive disease. Right femoral and popliteal arterial system is patent. There is at least 1 vessel runoff via the anterior tibial artery to the foot. There is suspected narrowing in the anterior tibial artery within the mid leg. The left common femoral and profunda femoral arteries are patent. The  proximal and mid left superficial femoral artery is inadequately evaluated due to artifact. The distal left superficial femoral artery is patent. Left popliteal artery is patent. There is at least 1 vessel runoff to the foot via the anterior tibial artery. Electronically Signed   By: Marybelle Killings M.D.   On: 08/22/2018 07:50   Mr Jodene Nam Lower Extremity Right Wo Contrast  Result Date: 08/22/2018 CLINICAL DATA:  Fibromuscular dysplasia of the renal arteries EXAM: MR MRA OF THE RIGHT LOWER EXTREMITY WITHOUT CONTRAST; MR MRA OF THE LEFT LOWER EXTREMITY WITHOUT CONTRAST TECHNIQUE: Multiplanar, multiecho pulse sequences of the bilateral lower extremities were obtained without intravenous contrast. Angiographic images of bilateral lower extremities were obtained using MRA technique without intravenous contrast. COMPARISON:  None. FINDINGS: Noncontrast MR imaging is submitted. Imaging is suboptimal for the evaluation of fibromuscular dysplasia as small webs can easily be missed by this technique. Distal aorta is patent.  IMA is patent. Bilateral common, internal, and external iliac arteries are patent. Right common  femoral, superficial femoral, and profunda femoral arteries are patent. Right popliteal artery is patent. The anterior tibial artery is patent to the foot. There is apparent narrowing in the anterior tibial artery within the mid leg. See image 85 of series 12. The peroneal artery is occluded. The posterior tibial artery is patent to the distal leg. Left common femoral and profunda femoral arteries are patent. There is artifact obscuring the proximal left superficial femoral artery. There is poor visualization of the left superficial femoral artery secondary to artifact. The distal left superficial femoral artery is patent. The proximal and mid superficial femoral artery is not adequately evaluated. Left popliteal artery is patent. The anterior tibial artery is patent to the foot. IMPRESSION: The examination is  limited in the evaluation of fibromuscular dysplasia as described above. No significant aortic or iliac arterial occlusive disease. Right femoral and popliteal arterial system is patent. There is at least 1 vessel runoff via the anterior tibial artery to the foot. There is suspected narrowing in the anterior tibial artery within the mid leg. The left common femoral and profunda femoral arteries are patent. The proximal and mid left superficial femoral artery is inadequately evaluated due to artifact. The distal left superficial femoral artery is patent. Left popliteal artery is patent. There is at least 1 vessel runoff to the foot via the anterior tibial artery. Electronically Signed   By: Marybelle Killings M.D.   On: 08/22/2018 07:50        Scheduled Meds: . aspirin  81 mg Oral Daily  . cholecalciferol  2,000 Units Oral Daily  . divalproex  125 mg Oral Q8H  . docusate sodium  100 mg Oral BID  . donepezil  10 mg Oral QHS  . enoxaparin (LOVENOX) injection  40 mg Subcutaneous Q24H  . feeding supplement (GLUCERNA SHAKE)  237 mL Oral TID BM  . feeding supplement (PRO-STAT SUGAR FREE 64)  30 mL Oral BID  . insulin aspart  0-5 Units Subcutaneous QHS  . insulin aspart  0-9 Units Subcutaneous TID WC  . insulin detemir  25 Units Subcutaneous BID  . memantine  10 mg Oral BID  . polyethylene glycol  17 g Oral Daily  . senna  2 tablet Oral QHS  . traZODone  25 mg Oral QHS  . vancomycin variable dose per unstable renal function (pharmacist dosing)   Does not apply See admin instructions   Continuous Infusions: . sodium chloride 75 mL/hr at 08/22/18 0843  . piperacillin-tazobactam (ZOSYN)  IV 3.375 g (08/22/18 0508)     LOS: 3 days     Vernell Leep, MD, FACP, [facility]. [facility]  To contact the attending provider between 7A-7P or the covering provider during after hours 7P-7A, please log into the web site [email or web address] and access using universal [place] password for that web site. If you  do not have the password, please call the hospital operator.  08/22/2018, 10:44 AM

## 2018-08-22 NOTE — Progress Notes (Signed)
  Progress Note    08/22/2018 1:58 PM Day of Surgery  Subjective: no overnight events, mra has been performed  Vitals:   08/21/18 2325 08/22/18 0811  BP: (!) 118/58 122/64  Pulse: 69 61  Resp: 19   Temp: 98.3 F (36.8 C) 98.3 F (36.8 C)  SpO2: 94% 94%    Physical Exam: Alert today Stable gangrenous changes of right toes, no evidence of infection  CBC    Component Value Date/Time   WBC 14.4 (H) 08/21/2018 0243   RBC 4.02 (L) 08/21/2018 0243   HGB 12.2 (L) 08/21/2018 0243   HCT 36.7 (L) 08/21/2018 0243   PLT 245 08/21/2018 0243   MCV 91.3 08/21/2018 0243   MCH 30.3 08/21/2018 0243   MCHC 33.2 08/21/2018 0243   RDW 12.7 08/21/2018 0243   LYMPHSABS 2.7 07/24/2007 0420   MONOABS 1.4 (H) 07/24/2007 0420   EOSABS 0.1 07/24/2007 0420   BASOSABS 0.0 07/24/2007 0420    BMET    Component Value Date/Time   NA 137 08/22/2018 0227   K 3.8 08/22/2018 0227   CL 103 08/22/2018 0227   CO2 23 08/22/2018 0227   GLUCOSE 173 (H) 08/22/2018 0227   BUN 18 08/22/2018 0227   CREATININE 1.94 (H) 08/22/2018 0227   CALCIUM 8.5 (L) 08/22/2018 0227   GFRNONAA 33 (L) 08/22/2018 0227   GFRAA 38 (L) 08/22/2018 0227    INR No results found for: INR   Intake/Output Summary (Last 24 hours) at 08/22/2018 1358 Last data filed at 08/22/2018 1129 Gross per 24 hour  Intake 675.08 ml  Output 250 ml  Net 425.08 ml     Assessment:  73 y.o. male is here with gangrenous changes of his right first and third toes  Plan: Or today for right above-knee amputation Antibiotics can be discontinued postop when infectious or smooth  Kyree Adriano C. Randie Heinzain, MD [facility] [place] Office: [phone] Pager: [phone]  08/22/2018 1:58 PM

## 2018-08-22 NOTE — Progress Notes (Signed)
CBG 75. Pt's eyes flicker when I call his name. Is non verbal/dementia per crna. Dr Stephannie Peters updated. No new orders except for floor to recheck CBG in 1 hr. OK to tx back to room.

## 2018-08-22 NOTE — Progress Notes (Addendum)
Pharmacy Antibiotic Note  Raymond Hunter is a 73 y.o. male admitted on 08/19/2018 with sepsis from gangrenous toe.  Pharmacy was consulted on 08/19/18  for Vancomycin and Zosyn dosing.  On day #4 of abx for diabtic foot infection/gangrene. Possible AKA per Vascular on 1/29. Afebrile, WBC 14.4. Has develop AKI, Scr increased to 1.94. , CrCl 38 ml/min I held the vancomycin dose this AM.  Checked a vancomycin trough level which is 42 mcg/ml on the Vancomycin 1000 mg IV q12 hour dosing, although not steady state level as vanc dose was recently decreased on 08/21/18 from 1500 mg IV q12h. Goal trough 15-20 mcg/ml.   [facility] showed >100 K of E. coli, pansensitive and insignificant colonies of Proteus. Already on Zosyn. Current zosyn dose 3.375 gm IV q8h , 4 hour infusion remains appropriate for CrCl > 20 ml/min.    Plan: Hold vancomycin.  Recheck Vancomycin random level in AM.  Redose vancomycin when level around 15-20 mcg/ml.  Continue Zosyn 3.375 gm IV q8hr (extended infusion) Will monitor renal function, C&S and vanc levels as appropriate F/U AKA tentatively scheduled for 1/29  Consider stopping Zosyn and switching to cefepime and Flagyl  Height: 5\' 10"  (177.8 cm)(pt daughter stated) Weight: 208 lb (94.3 kg) IBW/kg (Calculated) : 73  Temp (24hrs), Avg:98.2 F (36.8 C), Min:98 F (36.7 C), Max:98.3 F (36.8 C)  Recent Labs  Lab 08/19/18 1820 08/19/18 1937 08/19/18 2200 08/21/18 0243 08/22/18 0227 08/22/18 0803  WBC 15.1*  --   --  14.4*  --   --   CREATININE 0.87  --   --   --  1.94*  --   LATICACIDVEN  --  3.6* 1.8  --   --   --   VANCOTROUGH  --   --   --   --   --  42*    Estimated Creatinine Clearance: 38.5 mL/min (A) (by C-G formula based on SCr of 1.94 mg/dL (H)).    Not on File  Antimicrobials this admission: Vanc 1/25 >>  Zosyn 1/25 >>   Dosing adjustments:  1/28 VT = 42 mcg/ml on 1gm q12h x2 doses 1/27 , adjusted down from 1.5 gm q12h on 1/26 x2  doses -[facility] showed >100 K of E. coli, pansensitive and insignificant colonies of Proteus. Already on Zosyn.   Micro:  1/25 MRSA PCR: negative  Thank you for allowing pharmacy to be a part of this patient's care.  Noah Delaineuth Emmily Pellegrin, RPh Clinical Pharmacist Phone number [phone] Please check AMION for all [facility] phone numbers After 10:00 PM, call Main Pharmacy [phone] 08/22/2018 9:53 AM

## 2018-08-22 NOTE — Op Note (Signed)
    Patient name: EBERT BLOODSWORTH MRN: [id number] DOB: [birth year] Sex: male  08/22/2018 Pre-operative Diagnosis: Critical right lower extremity ischemia Post-operative diagnosis:  Same Surgeon:  Apolinar Junes C. Randie Heinz, MD Assistant: Doreatha Massed, PA Procedure Performed: Right above-knee amputation  Indications: 73 year old male with critical right lower extremity ischemia.  He has dementia does not walk.  He is indicated for right above-knee amputation.  Findings: SFA had pulsatile bleeding on the right.  There is adequate bleeding in the tissue to suggest healing.   Procedure:  The patient was identified in the holding area and taken to the operating room was placed supine operative when general anesthesia induced.  He was sterilely prepped draped in the right lower extremity usual fashion antibiotics were already up-to-date timeout was called.  [place] type incision was made above the knee.  We dissected down to the bone elevator the periosteum transected with Gigli saw and smoothed with rasp.  Posterior flap was created with amputation knife.  Vessels were clamped suture ligated.  Hemostasis obtained wound was irrigated.  Fascia was approximated with interrupted 2-0 Vicryl suture.  Skin was closed with staples.  He is awake and anesthesia having tolerated procedure without immediate complication.  Counts were correct at completion.  EBL 100 cc    Brandon C. Randie Heinz, MD [facility] [place] Office: [phone] Pager: [phone]

## 2018-08-22 NOTE — Progress Notes (Signed)
According to the patients MAR the patient was given a Glucerna shake at 1050 this morning.  Dr. Stephannie Peters made aware and Dr. Randie Heinz made aware.  I called the floor RN Danielle to make sure that was correct.  Danielle told me that the patient did not drink the Glucerna, that it was never opened.  Message was relayed to Dr. Randie Heinz and to Dr. Stephannie Peters and we are ok to proceed with surgery

## 2018-08-22 NOTE — Progress Notes (Signed)
Notified Dr. Johnn Hai that patient received meds with a bite of applesauce. Per Dr. Stephannie Peters patient cannot go until 1400

## 2018-08-22 NOTE — Anesthesia Preprocedure Evaluation (Addendum)
Anesthesia Evaluation  Patient identified by MRN, date of birth, ID band Patient confused    Reviewed: Allergy & Precautions, NPO status , Patient's Chart, lab work & pertinent test results  History of Anesthesia Complications Negative for: history of anesthetic complications  Airway Mallampati: III  TM Distance: >3 FB Neck ROM: Full    Dental  (+) Edentulous Upper, Edentulous Lower   Pulmonary neg pulmonary ROS, former smoker,    Pulmonary exam normal breath sounds clear to auscultation       Cardiovascular hypertension, Pt. on medications Normal cardiovascular exam Rhythm:Regular Rate:Normal     Neuro/Psych Anxiety Dementia negative neurological ROS     GI/Hepatic negative GI ROS, Neg liver ROS,   Endo/Other  diabetes, Poorly Controlled, Type 2, Insulin Dependent, Oral Hypoglycemic Agents  Renal/GU ARFRenal diseaseCr 1.94 on 08/22/18 (baseline approx 0.9)     Musculoskeletal negative musculoskeletal ROS (+) Gangrene right foot   Abdominal   Peds  Hematology negative hematology ROS (+) anemia , Hgb 12.2   Anesthesia Other Findings Day of surgery medications reviewed with the patient.  Reproductive/Obstetrics                            Anesthesia Physical Anesthesia Plan  ASA: III  Anesthesia Plan: General   Post-op Pain Management:    Induction: Intravenous  PONV Risk Score and Plan: 2 and Treatment may vary due to age or medical condition and Ondansetron  Airway Management Planned: Oral ETT  Additional Equipment:   Intra-op Plan:   Post-operative Plan: Extubation in OR  Informed Consent: I have reviewed the patients History and Physical, chart, labs and discussed the procedure including the risks, benefits and alternatives for the proposed anesthesia with the patient or authorized representative who has indicated his/her understanding and acceptance.   Patient has DNR.   Discussed DNR with power of attorney and Continue DNR.   Dental advisory given  Plan Discussed with: CRNA  Anesthesia Plan Comments: (Pt had applesauce with medications at 08:00 today. Plan to proceed at 2p or later. Anesthesia consent discussed with pt's daughter Eunice Blase. Will continue DNR status perioperatively.)       Anesthesia Quick Evaluation

## 2018-08-22 NOTE — Transfer of Care (Signed)
Immediate Anesthesia Transfer of Care Note  Patient: Raymond Hunter  Procedure(s) Performed: AMPUTATION ABOVE KNEE (Right )  Patient Location: PACU  Anesthesia Type:General  Level of Consciousness: sedated  Airway & Oxygen Therapy: Patient Spontanous Breathing and Patient connected to nasal cannula oxygen  Post-op Assessment: Report given to RN and Post -op Vital signs reviewed and stable  Post vital signs: Reviewed and stable  Last Vitals:  Vitals Value Taken Time  BP    Temp    Pulse    Resp    SpO2      Last Pain:  Vitals:   08/22/18 1722  TempSrc: Oral  PainSc:       Patients Stated Pain Goal: 0 (08/19/18 1800)  Complications: No apparent anesthesia complications

## 2018-08-22 NOTE — Progress Notes (Signed)
PT Cancellation Note  Patient Details Name: Raymond Hunter MRN: [id number] DOB: 12-05-1945   Cancelled Treatment:    Reason Eval/Treat Not Completed: Patient at procedure or test/unavailable. Pt in surgery.   Angelina Ok [facility] 08/22/2018, 1:47 PM Skip Mayer PT Acute Rehabilitation Services Pager [phone] Office [phone]

## 2018-08-23 ENCOUNTER — Encounter (HOSPITAL_COMMUNITY): Payer: Self-pay | Admitting: Vascular Surgery

## 2018-08-23 ENCOUNTER — Telehealth: Payer: Self-pay | Admitting: Vascular Surgery

## 2018-08-23 DIAGNOSIS — Z89611 Acquired absence of right leg above knee: Secondary | ICD-10-CM

## 2018-08-23 LAB — BASIC METABOLIC PANEL
Anion gap: 10 (ref 5–15)
BUN: 19 mg/dL (ref 8–23)
CO2: 22 mmol/L (ref 22–32)
Calcium: 8 mg/dL — ABNORMAL LOW (ref 8.9–10.3)
Chloride: 106 mmol/L (ref 98–111)
Creatinine, Ser: 1.98 mg/dL — ABNORMAL HIGH (ref 0.61–1.24)
GFR calc Af Amer: 37 mL/min — ABNORMAL LOW (ref >60)
GFR calc non Af Amer: 32 mL/min — ABNORMAL LOW (ref >60)
Glucose, Bld: 194 mg/dL — ABNORMAL HIGH (ref 70–99)
Potassium: 3.9 mmol/L (ref 3.5–5.1)
Sodium: 138 mmol/L (ref 135–145)

## 2018-08-23 LAB — GLUCOSE, CAPILLARY
GLUCOSE-CAPILLARY: 161 mg/dL — AB (ref 70–99)
Glucose-Capillary: 136 mg/dL — ABNORMAL HIGH (ref 70–99)
Glucose-Capillary: 178 mg/dL — ABNORMAL HIGH (ref 70–99)
Glucose-Capillary: 195 mg/dL — ABNORMAL HIGH (ref 70–99)

## 2018-08-23 LAB — CBC
HEMATOCRIT: 31.5 % — AB (ref 39.0–52.0)
Hemoglobin: 10.4 g/dL — ABNORMAL LOW (ref 13.0–17.0)
MCH: 30.8 pg (ref 26.0–34.0)
MCHC: 33 g/dL (ref 30.0–36.0)
MCV: 93.2 fL (ref 80.0–100.0)
Platelets: 246 10*3/uL (ref 150–400)
RBC: 3.38 MIL/uL — ABNORMAL LOW (ref 4.22–5.81)
RDW: 13.2 % (ref 11.5–15.5)
WBC: 17.6 10*3/uL — ABNORMAL HIGH (ref 4.0–10.5)
nRBC: 0 % (ref 0.0–0.2)

## 2018-08-23 MED ORDER — ACETAMINOPHEN 500 MG PO TABS
1000.0000 mg | ORAL_TABLET | Freq: Three times a day (TID) | ORAL | Status: DC
  Administered 2018-08-23 – 2018-08-26 (×10): 1000 mg via ORAL
  Filled 2018-08-23 (×10): qty 2

## 2018-08-23 NOTE — Progress Notes (Signed)
Occupational Therapy Re-Evaluation Patient Details Name: Raymond Hunter MRN: [id number] DOB: 1946/07/09 Today's Date: 08/23/2018    History of Present Illness 73 year old male, resident of [facility] in [place], [place], bedbound for several weeks now, prior to that mobile with the help of a wheelchair, initially transferred from SNF to [facility] on 08/18/2018 due to 2 black right foot toes with surrounding redness.  He was evaluated to have sepsis due to potential right diabetic foot infection and possible UTI.  He was treated per sepsis protocol with IV fluids and IV vancomycin and Zosyn.  Due to concern for critical right limb ischemia and need for further evaluation by vascular surgery at a tertiary center, he was transferred to [facility] on 1/25.  Vascular surgery consulted.  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 Pt will benefit from continued acute OT to address the below listed deficits and maximize independence with basic ADLs.  Pt completed rolling to each side with 2 Assist. Total A for most ADLs. SNF appropriate at d/c.    Follow Up Recommendations  SNF;Supervision/Assistance - 24 hour    Equipment Recommendations  None recommended by OT    Recommendations for Other Services       Precautions / Restrictions Precautions Precautions: Fall Precaution Comments: at risk for skin breakdown Restrictions Weight Bearing Restrictions: No      Mobility Bed Mobility Overal bed mobility: Needs Assistance Bed Mobility: Rolling Rolling: Max assist;+2 for physical assistance         General bed mobility comments: patient partipciating more, max A of 2-3 to roll in bed.   Transfers                 General transfer comment: NA    Balance Overall balance assessment: Needs assistance   Sitting balance-Leahy Scale: Poor                                      ADL either performed or assessed with clinical judgement   ADL Overall ADL's : Needs assistance/impaired Eating/Feeding: Supervision/ safety;Set up;Sitting   Grooming: Oral care;Wash/dry face;Moderate assistance   Upper Body Bathing: Total assistance   Lower Body Bathing: Total assistance;+2 for physical assistance;Bed level   Upper Body Dressing : Total assistance   Lower Body Dressing: Total assistance;+2 for physical assistance                 General ADL Comments: Pt found incontinent of bowel upon OT/PT arrival. Bed mobility and pericare during session. Unable to progress to EOB this session.     Vision         Perception     Praxis      Pertinent Vitals/Pain Pain Assessment: Faces Faces Pain Scale: Hurts a little bit Pain Location: generalized Pain Descriptors / Indicators: Grimacing Pain Intervention(s): Monitored during session;Limited activity within patient's tolerance;Repositioned     Hand Dominance     Extremity/Trunk Assessment Upper Extremity Assessment Upper Extremity Assessment: Generalized weakness   Lower Extremity Assessment Lower Extremity Assessment: Defer to PT evaluation       Communication Communication Communication: No difficulties(minimal verbalization)   Cognition Arousal/Alertness: Awake/alert Behavior During Therapy: Flat affect Overall Cognitive Status: History of cognitive impairments - at baseline  General Comments: oriented to person only   General Comments       Exercises     Shoulder Instructions      Home Living Family/patient expects to be discharged to:: Skilled nursing facility                                        Prior Functioning/Environment Level of Independence: Needs assistance  Gait / Transfers Assistance Needed: Has lived at SNF for 4 yeasr; mobile @ wc level; "bedbound for sesveral weeks PTA ADL's / Homemaking Assistance  Needed: Total A with the exception of feeidng   Comments: per daugther patient bed bound for weeks. unable to stand pivot into w/c for some time now        OT Problem List: Decreased strength;Decreased range of motion;Decreased activity tolerance;Impaired balance (sitting and/or standing);Decreased coordination;Decreased cognition;Decreased safety awareness;Decreased knowledge of use of DME or AE;Impaired sensation;Pain;Increased edema      OT Treatment/Interventions: Self-care/ADL training;Therapeutic exercise;Therapeutic activities;Patient/family education    OT Goals(Current goals can be found in the care plan section) Acute Rehab OT Goals Patient Stated Goal: pt unable to state OT Goal Formulation: Patient unable to participate in goal setting Time For Goal Achievement: 09/06/18 Potential to Achieve Goals: Fair  OT Frequency: Min 2X/week   Barriers to D/C:            Co-evaluation PT/OT/SLP Co-Evaluation/Treatment: Yes Reason for Co-Treatment: For patient/therapist safety;Necessary to address cognition/behavior during functional activity PT goals addressed during session: Mobility/safety with mobility;Strengthening/ROM;Balance OT goals addressed during session: ADL's and self-care      AM-PAC OT "6 Clicks" Daily Activity     Outcome Measure Help from another person eating meals?: A Lot Help from another person taking care of personal grooming?: A Lot Help from another person toileting, which includes using toliet, bedpan, or urinal?: Total Help from another person bathing (including washing, rinsing, drying)?: Total Help from another person to put on and taking off regular upper body clothing?: Total Help from another person to put on and taking off regular lower body clothing?: Total 6 Click Score: 8   End of Session    Activity Tolerance:   Patient left: in bed;with call bell/phone within reach;with bed alarm set  OT Visit Diagnosis: Other abnormalities of gait and  mobility (R26.89);Muscle weakness (generalized) (M62.81);Other symptoms and signs involving cognitive function;Pain Pain - Right/Left: Right Pain - part of body: Ankle and joints of foot                Time: 1240-1310 OT Time Calculation (min): 30 min Charges:  OT General Charges $OT Visit: 1 Visit OT Evaluation $OT Re-eval: 1 Re-eval  Raynald Kemp, OT Acute Rehabilitation Services Pager: [phone] Office: [phone]   Pilar Grammes 08/23/2018, 2:22 PM

## 2018-08-23 NOTE — Evaluation (Signed)
Physical Therapy Re-Evaluation Patient Details Name: Raymond Hunter [place] MRN: [id number] DOB: 1945/07/31 Today'Hunter Date: 08/23/2018   History of Present Illness  73 year old male, resident of [facility] in [place], [place], bedbound for several weeks now, prior to that mobile with the help of a wheelchair, initially transferred from SNF to [facility] on 08/18/2018 due to 2 black right foot toes with surrounding redness.  He was evaluated to have sepsis due to potential right diabetic foot infection and possible UTI.  He was treated per sepsis protocol with IV fluids and IV vancomycin and Zosyn.  Due to concern for critical right limb ischemia and need for further evaluation by vascular surgery at a tertiary center, he was [facility] on 1/[facility].  Vascular surgery consulted.  now Hunter/p AKA R 08/22/18    Clinical Impression  Patient re-evaluated Hunter/p R AKA 08/22/18. Patient remains suitable for SNF with post acute rehab. Today'Hunter session focused on bed mobility, rolling with max A of 2 for peri-care. Patient more compliant with therapy today, following cues ~25% of time. Will attempt sitting EOB next session if tolerable.      Follow Up Recommendations SNF    Equipment Recommendations       Recommendations for Other Services       Precautions / Restrictions Precautions Precautions: Fall Precaution Comments: at risk for skin breakdown Restrictions Weight Bearing Restrictions: No      Mobility  Bed Mobility Overal bed mobility: Needs Assistance Bed Mobility: Rolling Rolling: Max assist;+2 for physical assistance         General bed mobility comments: patient partipciating more, max A of 2-3 to roll in bed.   Transfers                    Ambulation/Gait                Stairs            Wheelchair Mobility    Modified Rankin (Stroke Patients Only)       Balance Overall balance assessment: Needs assistance   Sitting  balance-Leahy Scale: Poor                                       Pertinent Vitals/Pain Pain Assessment: No/denies pain Faces Pain Scale: Hurts a little bit Pain Location: generalized Pain Descriptors / Indicators: Grimacing Pain Intervention(Hunter): Limited activity within patient'Hunter tolerance    Home Living Family/patient expects to be discharged to:: Skilled nursing facility                      Prior Function Level of Independence: Needs assistance   Gait / Transfers Assistance Needed: Has lived at SNF for 4 yeasr; mobile @ wc level; "bedbound for sesveral weeks PTA  ADL'Hunter / Homemaking Assistance Needed: Total A with the exception of feeidng  Comments: per daugther patient bed bound for weeks. unable to stand pivot into w/c for some time now     Hand Dominance        Extremity/Trunk Assessment   Upper Extremity Assessment Upper Extremity Assessment: Difficult to assess due to impaired cognition    Lower Extremity Assessment Lower Extremity Assessment: Difficult to assess due to impaired cognition       Communication   Communication: No difficulties  Cognition Arousal/Alertness: Awake/alert Behavior During Therapy: Flat affect Overall Cognitive Status: History of  cognitive impairments - at baseline                                 General Comments: oriented to person only      General Comments      Exercises     Assessment/Plan    PT Assessment Patient needs continued PT services  PT Problem List Decreased strength       PT Treatment Interventions      PT Goals (Current goals can be found in the Care Plan section)  Acute Rehab PT Goals Patient Stated Goal: pt unable to state PT Goal Formulation: With patient Time For Goal Achievement: 09/03/18 Potential to Achieve Goals: Poor    Frequency Min 2X/week   Barriers to discharge        Co-evaluation PT/OT/SLP Co-Evaluation/Treatment: Yes Reason for  Co-Treatment: For patient/therapist safety PT goals addressed during session: Mobility/safety with mobility;Strengthening/ROM;Balance         AM-PAC PT "6 Clicks" Mobility  Outcome Measure Help needed turning from your back to your side while in a flat bed without using bedrails?: Total Help needed moving from lying on your back to sitting on the side of a flat bed without using bedrails?: Total Help needed moving to and from a bed to a chair (including a wheelchair)?: Total Help needed standing up from a chair using your arms (e.g., wheelchair or bedside chair)?: Total Help needed to walk in hospital room?: Total Help needed climbing 3-5 steps with a railing? : Total 6 Click Score: 6    End of Session   Activity Tolerance: Treatment limited secondary to agitation Patient left: in bed;with call bell/phone within reach;with nursing/sitter in room;with family/visitor present Nurse Communication: Mobility status PT Visit Diagnosis: Unsteadiness on feet (R26.81)    Time: 1240-1310 PT Time Calculation (min) (ACUTE ONLY): 30 min   Charges:   PT Evaluation $PT Re-evaluation: 1 Re-eval         Etta Grandchild, PT, DPT Acute Rehabilitation Services Pager: [phone] Office: [phone]    Karry Gaebler 08/23/2018, 2:12 PM

## 2018-08-23 NOTE — Clinical Social Work Placement (Signed)
   CLINICAL SOCIAL WORK PLACEMENT  NOTE  Date:  08/23/2018  Patient Details  Name: Raymond Hunter MRN: [id number] Date of Birth: [birth year]  Clinical Social Work is seeking post-discharge placement for this patient at the Skilled  Nursing Facility level of care (*CSW will initial, date and re-position this form in  chart as items are completed):      Patient/family provided with [facility] Clinical Social Work Department's list of facilities offering this level of care within the geographic area requested by the patient (or if unable, by the patient's family).  Yes   Patient/family informed of their freedom to choose among providers that offer the needed level of care, that participate in Medicare, Medicaid or managed care program needed by the patient, have an available bed and are willing to accept the patient.      Patient/family informed of [place]'s ownership interest in [facility] and [facility], as well as of the fact that they are under no obligation to receive care at these facilities.  PASRR submitted to EDS on       PASRR number received on 08/21/18     Existing PASRR number confirmed on 08/21/18     FL2 transmitted to all facilities in geographic area requested by pt/family on 08/21/18     FL2 transmitted to all facilities within larger geographic area on       Patient informed that his/her managed care company has contracts with or will negotiate with certain facilities, including the following:        Yes   Patient/family informed of bed offers received.  Patient chooses bed at [facility]     Physician recommends and patient chooses bed at      Patient to be transferred to [facility] on 08/24/18.  Patient to be transferred to facility by [facility]AR     Patient family notified on 08/24/18 of transfer.  Name of family member notified:        PHYSICIAN       Additional Comment:     _______________________________________________ Maree Krabbe, LCSW 08/23/2018, 2:18 PM

## 2018-08-23 NOTE — Telephone Encounter (Signed)
sch appt phone NA mld ltr 09/22/2018 845am p/o MD

## 2018-08-23 NOTE — Anesthesia Postprocedure Evaluation (Signed)
Anesthesia Post Note  Patient: Raymond Hunter  Procedure(s) Performed: AMPUTATION ABOVE KNEE (Right )     Patient location during evaluation: PACU Anesthesia Type: General Level of consciousness: awake and alert Pain management: pain level controlled Vital Signs Assessment: post-procedure vital signs reviewed and stable Respiratory status: spontaneous breathing, nonlabored ventilation and respiratory function stable Cardiovascular status: blood pressure returned to baseline and stable Postop Assessment: no apparent nausea or vomiting Anesthetic complications: no    Last Vitals:  Vitals:   08/23/18 0753 08/23/18 1720  BP: 116/61 103/77  Pulse: 71 81  Resp:    Temp: (!) 38.3 C 37.9 C  SpO2: 95% 96%    Last Pain:  Vitals:   08/23/18 1430  TempSrc:   PainSc: Asleep   Pain Goal: Patients Stated Pain Goal: 0 (08/19/18 1800)                 Kaylyn Layer

## 2018-08-23 NOTE — Progress Notes (Signed)
Daily Progress Note   Patient Name: Raymond Hunter       Date: 08/23/2018 DOB: [birth year]  Age: 73 y.o. MRN#: [id number] Attending Physician: Noralee Stain, DO Primary Care Physician: Charlott Rakes, MD Admit Date: 08/19/2018  Reason for Consultation/Follow-up: Establishing goals of care and Psychosocial/spiritual support  Subjective: Raymond Hunter is nonverbal. He does look at me and nods to simple questions but not consistently.   Length of Stay: 4  Current Medications: Scheduled Meds:  . aspirin  81 mg Oral Daily  . cholecalciferol  2,000 Units Oral Daily  . divalproex  125 mg Oral Q8H  . docusate sodium  100 mg Oral BID  . donepezil  10 mg Oral QHS  . enoxaparin (LOVENOX) injection  40 mg Subcutaneous Q24H  . feeding supplement (GLUCERNA SHAKE)  237 mL Oral TID BM  . feeding supplement (PRO-STAT SUGAR FREE 64)  30 mL Oral BID  . insulin aspart  0-5 Units Subcutaneous QHS  . insulin aspart  0-9 Units Subcutaneous TID WC  . insulin detemir  25 Units Subcutaneous BID  . memantine  10 mg Oral BID  . pantoprazole  40 mg Oral Daily  . polyethylene glycol  17 g Oral Daily  . senna  2 tablet Oral QHS  . traZODone  25 mg Oral QHS    Continuous Infusions: . magnesium sulfate 1 - 4 g bolus IVPB      PRN Meds: acetaminophen **OR** acetaminophen, albuterol, alum & mag hydroxide-simeth, bisacodyl, guaiFENesin-dextromethorphan, hydrALAZINE, labetalol, magnesium sulfate 1 - 4 g bolus IVPB, metoprolol tartrate, morphine injection, ondansetron, phenol, potassium chloride, traMADol  Physical Exam Vitals signs and nursing note reviewed.  Constitutional:      Appearance: He is ill-appearing.     Comments: Pale   Cardiovascular:     Rate and Rhythm: Normal rate.  Pulmonary:     Effort:  Pulmonary effort is normal. No tachypnea, accessory muscle usage or respiratory distress.  Abdominal:     General: Abdomen is flat.  Neurological:     Mental Status: He is lethargic and disoriented.             Vital Signs: BP 103/77 (BP Location: Left Leg)   Pulse 81   Temp 100.2 F (37.9 C)   Resp 18   Ht 5\' 10"  (1.778  m)   Wt 94.3 kg   SpO2 96%   BMI 29.84 kg/m  SpO2: SpO2: 96 % O2 Device: O2 Device: Nasal Cannula O2 Flow Rate: O2 Flow Rate (L/min): 10 L/min  Intake/output summary:   Intake/Output Summary (Last 24 hours) at 08/23/2018 1907 Last data filed at 08/23/2018 1729 Gross per 24 hour  Intake 240 ml  Output 1100 ml  Net -860 ml   LBM: Last BM Date: (PTA) Baseline Weight: Weight: 94.8 kg Most recent weight: Weight: 94.3 kg       Palliative Assessment/Data:    Flowsheet Rows     Most Recent Value  Intake Tab  Referral Department  Hospitalist  Unit at Time of Referral  Med/Surg Unit  Palliative Care Primary Diagnosis  Sepsis/Infectious Disease  Date Notified  08/20/18  Palliative Care Type  New Palliative care  Reason for referral  Clarify Goals of Care, Psychosocial or Spiritual support, Counsel Regarding Hospice  Date of Admission  08/19/18  Date first seen by Palliative Care  08/20/18  # of days Palliative referral response time  0 Day(s)  # of days IP prior to Palliative referral  1  Clinical Assessment  Palliative Performance Scale Score  30%  Pain Max last 24 hours  Not able to report  Pain Min Last 24 hours  Not able to report  Dyspnea Max Last 24 Hours  Not able to report  Dyspnea Min Last 24 hours  Not able to report  Nausea Max Last 24 Hours  Not able to report  Nausea Min Last 24 Hours  Not able to report  Anxiety Max Last 24 Hours  Not able to report  Anxiety Min Last 24 Hours  Not able to report  Other Max Last 24 Hours  Not able to report  Psychosocial & Spiritual Assessment  Palliative Care Outcomes  Patient/Family meeting held?   Yes  Who was at the meeting?  dtr Darlina Guyseborah HCPOA  Patient/Family wishes: Interventions discontinued/not started   Mechanical Ventilation, Hemodialysis, PEG, Trach, Vasopressors, NIPPV, Tube feedings/TPN  Palliative Care follow-up planned  Yes, Facility      Patient Active Problem List   Diagnosis Date Noted  . Goals of care, counseling/discussion   . Palliative care by specialist   . Gangrene of toe of right foot (HCC) 08/19/2018  . Cellulitis of foot, right 08/19/2018  . Diabetic infection of right foot (HCC) 08/19/2018  . Sepsis (HCC) 08/19/2018  . Alzheimer's dementia (HCC) 08/19/2018  . Type 2 diabetes mellitus with peripheral neuropathy (HCC) 08/19/2018  . Essential hypertension 08/19/2018  . Urinary retention 08/19/2018  . Neurogenic bladder 08/19/2018  . Pressure injury of skin 08/19/2018    Palliative Care Assessment & Plan   HPI: 73 y.o. male  with past medical history of advanced dementia, diabetes, HTN, neurogenic bladder (had been doing in and out caths but now has permanent indwelling Foley catheter), BPH, recurrent UTIs, peripheral arterial disease admitted on 08/19/2018 from skilled nursing facility to [facility] secondary to black right great toe and third toe.  [facility] to critical limb ischemia, consultation with vascular surgeon. 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 Renal function has declined post-op.   Assessment: Raymond Hunter is resting comfortably in bed. He is s/p amputation 08/22/18. He is unable to speak with me but nods head occasionally. Per RN he did eat some breakfast but only ~50% with [facility] encouragement and time. Per PT/OT they say that he does yell out curse words  during therapy but this is all that he verbalizes. He is mostly bed bound. [facility] [place] [facility].   I spoke with daughter/HCPOA, Eunice Blaseebbie, via telephone. She is interested in pursuing hospice at SNF for her father because "we all  know he doesn't have long." She is interested in focusing on comfort and minimizing his suffering. She does not want him to be in any pain. She is clear that if she feels that her father is suffering in any way then she has no problem "with letting him go." I educated that I left MOST form and Hard Choices for her to review. I am working with CSW [facility]   Recommendations/Plan:  Return to SNF with hospice.   Pain post-op: Recommend Roxanol 5 mg every 2 hours prn upon discharge. I will schedule some Tylenol for pain control and fevers.   Goals of Care and Additional Recommendations:  Limitations on Scope of Treatment: No Artificial Feeding  Code Status:  DNR  Prognosis:   < 6 months  Discharge Planning:  Skilled Nursing Facility with Hospice  Care plan was discussed with CSW.   Thank you for allowing the Palliative Medicine Team to assist in the care of this patient.   Total Time 35 min Prolonged Time Billed  no       Greater than 50%  of this time was spent counseling and coordinating care related to the above assessment and plan.  Yong ChannelAlicia Job [place], NP Palliative Medicine Team Pager # [phone] (M-F 8a-5p) Team Phone # [phone] (Nights/Weekends)

## 2018-08-23 NOTE — Consult Note (Addendum)
WOC Nurse wound consult note Reason for Consult: Consult requested for buttocks/sacrum.  Pt was noted to have a stage 1 reddened area upon admission to left buttock according to the nursing flowsheet; this has evolved into a 50% dark reddish purple deep tissue pressure injury and 50% stage 2 red moist pressure across sacrum/upper buttocks Pressure Injury POA: Yes Measurement: 3X1X.1cm Drainage (amount, consistency, odor) small amt tan drainage, no odor or fluctuance Periwound: intact Dressing procedure/placement/frequency: Foam dressing to protect and promote healing and attempt to keep area from becoming soiled; this is difficult since pt is frequently incontinent of stool and location is in close proximity to rectum.  Discussed plan of care with patient but he does not appear to understand and there are no family members present. Please re-consult if further assistance is needed.  Thank-you,  Cammie Mcgee MSN, RN, CWOCN, [place], CNS [phone]

## 2018-08-23 NOTE — Telephone Encounter (Signed)
-----   Message from Dara LordsSamantha J Rhyne, [place] sent at 08/22/2018  4:30 PM EST ----- S/p right aka.  F/u with Dr. Randie Heinzain in 4 weeks.  Thanks

## 2018-08-23 NOTE — Care Management Important Message (Signed)
Important Message  Patient Details  Name: Raymond Hunter MRN: [id number] Date of Birth: 05-Nov-1945   Medicare Important Message Given:  Yes    Dorena Bodo 08/23/2018, 4:05 PM

## 2018-08-23 NOTE — Clinical Social Work Note (Signed)
CSW confirmed pt can go to [facility] in [place] with Hospice following. Daughter notified and appreciative.  [place], [place] [phone]

## 2018-08-23 NOTE — Care Management Important Message (Signed)
Important Message  Patient Details  Name: Raymond Hunter MRN: [id number] Date of Birth: April 01, 1946   Medicare Important Message Given:  Yes    Dorena Bodo 08/23/2018, 4:07 PM

## 2018-08-23 NOTE — Progress Notes (Addendum)
[facility]  Subjective  - Resting does not open eyes or speak.   Objective 116/61 71 (!) 101 F (38.3 C) (Oral) 18 95%  Intake/Output Summary (Last 24 hours) at 08/23/2018 1012 Last data filed at 08/23/2018 0540 Gross per 24 hour  Intake 340 ml  Output 850 ml  Net -510 ml    Lungs non labored breathing Right AKA stump dressing clean and dry.  Assessment/Planning: POD # 1   Right AKA Plan to change dressing tomorrow  Mosetta Pigeon 08/23/2018 10:12 AM --  Laboratory Lab Results: Recent Labs    08/21/18 0243 08/23/18 0300  WBC 14.4* 17.6*  HGB 12.2* 10.4*  HCT 36.7* 31.5*  PLT 245 246   BMET Recent Labs    08/22/18 0227 08/23/18 0300  NA 137 138  K 3.8 3.9  CL 103 106  CO2 23 22  GLUCOSE 173* 194*  BUN 18 19  CREATININE 1.94* 1.98*  CALCIUM 8.5* 8.0*    COAG No results found for: INR, PROTIME No results found for: PTT  I have independently interviewed and examined patient and agree with PA assessment and plan above. Dressing down tomorrow.    Rula Keniston C. Randie Heinz, MD [facility] Office: [phone] Pager: [phone]

## 2018-08-23 NOTE — Progress Notes (Signed)
PROGRESS NOTE    KORIN HARTWELL  ZOX:[id number] DOB: 1946-04-27 DOA: 08/19/2018 PCP: Charlott Rakes, MD     Brief Narrative:  BOBAK OGUINN is a 73 year old male, resident of [facility] [place], [place], bedbound for several weeks now, prior to that mobile with the help of a wheelchair, initially transferred from SNF to [facility] on 08/18/2018 due to2black right foot toes with surrounding redness. He was evaluated to have sepsis due to potential right diabetic foot infection and possible UTI. He was treated per sepsis protocol with IV fluids and IV vancomycin and Zosyn. Due to concern for critical right limb ischemia and need for further evaluation by vascular surgery at a tertiary center, he was transferred to [facility] on 1/25.  Vascular surgery consulted and after PMT input, underwent right AKA on 1/28.  Hospital course complicated by acute kidney injury, possibly related to vancomycin toxicity.  New events last 24 hours / Subjective: No new events overnight  Assessment & Plan:   Principal Problem:   Gangrene of toe of right foot (HCC) Active Problems:   Cellulitis of foot, right   Diabetic infection of right foot (HCC)   Sepsis (HCC)   Alzheimer's dementia (HCC)   Type 2 diabetes mellitus with peripheral neuropathy (HCC)   Essential hypertension   Urinary retention   Neurogenic bladder   Pressure injury of skin   Goals of care, counseling/discussion   Palliative care by specialist   Sepsis secondary to diabetic right foot infection/gangrene of the right foot with PAD  -Vascular surgery following -Status post right AKA 1/28 -Stop IV antibiotics now that infectious source has been removed  Acute kidney injury -Creatinine increased from 0.87 on 1/25 --> 1.94 on 1/28.  Bladder scan shows no urinary retention and he has a Foley catheter.  Acute kidney injury likely related to vancomycin toxicity. Discontinued lisinopril, vancomycin. Trend BMP   E.  coli CAUTI versus asymptomatic bacteriuria -Urine culture from [facility] showed >100 K of E. coli, pansensitive and insignificant colonies of Proteus -Given Zosyn for 4 days   BPH/neurogenic bladder/urinary retention/possible CAUTI -Patient used to have in and out catheterization done 3 times daily for a couple of years until recently. Now has indwelling catheter for the last week. Urinary catheter was reportedly changed at [facility] on day of admission. Patient unable to provide history of dysuria  Type II DM with peripheral neuropathy -Levemir and add NovoLog SSI  Essential hypertension -Lisinopril discontinued due to acute kidney injury   Advanced Alzheimer's dementia -Continue Aricept, Namenda and Depakote.  Mental status likely at baseline.  Adult failure to thrive -Multifactorial secondary to advanced dementia complicated by multiple severe significant medical problems and advanced age.  PMT input 1/26 appreciated: DNR confirmed, no PEG tube or artificial feeding, would like to proceed with right AKA, palliative team will continue to follow, hospice at discharge.   DVT prophylaxis: Lovenox Code Status: DNR Family Communication: No family at bedside Disposition Plan: Back to SNF once cleared from vascular surgery standpoint and improvement in acute kidney injury   Consultants:   Vascular surgery  Palliative care medicine    Antimicrobials:  Anti-infectives (From admission, onward)   Start     Dose/Rate Route Frequency Ordered Stop   08/22/18 0817  vancomycin variable dose per unstable renal function (pharmacist dosing)  Status:  Discontinued      Does not apply See admin instructions 08/22/18 0817 08/23/18 0732   08/21/18 0900  vancomycin (VANCOCIN) IVPB 1000  mg/200 mL premix  Status:  Discontinued     1,000 mg 200 mL/hr over 60 Minutes Intravenous Every 12 hours 08/21/18 0837 08/22/18 0813   08/20/18 0800  vancomycin (VANCOCIN) 1,500 mg in sodium  chloride 0.9 % 500 mL IVPB  Status:  Discontinued     1,500 mg 250 mL/hr over 120 Minutes Intravenous Every 12 hours 08/19/18 2014 08/21/18 0837   08/20/18 0400  piperacillin-tazobactam (ZOSYN) IVPB 3.375 g  Status:  Discontinued     3.375 g 12.5 mL/hr over 240 Minutes Intravenous Every 8 hours 08/19/18 2014 08/23/18 0732   08/19/18 2015  vancomycin (VANCOCIN) 2,000 mg in sodium chloride 0.9 % 500 mL IVPB     2,000 mg 250 mL/hr over 120 Minutes Intravenous  Once 08/19/18 2011 08/20/18 0624   08/19/18 2000  piperacillin-tazobactam (ZOSYN) IVPB 3.375 g     3.375 g 100 mL/hr over 30 Minutes Intravenous  Once 08/19/18 1950 08/20/18 0624       Objective: Vitals:   08/22/18 1722 08/22/18 1837 08/22/18 2347 08/23/18 0753  BP: (!) 124/58 (!) 147/58 (!) 151/62 116/61  Pulse: (!) 58 (!) 57 64 71  Resp: 16  18   Temp: 98.4 F (36.9 C) 98.2 F (36.8 C) 98.4 F (36.9 C) (!) 101 F (38.3 C)  TempSrc: Oral Axillary Axillary Oral  SpO2: 92% 100% 100% 95%  Weight:      Height:        Intake/Output Summary (Last 24 hours) at 08/23/2018 1337 Last data filed at 08/23/2018 0540 Gross per 24 hour  Intake 290 ml  Output 600 ml  Net -310 ml   Filed Weights   08/19/18 2000 08/21/18 0500 08/22/18 1340  Weight: 94.8 kg 94.3 kg 94.3 kg    Examination:  General exam: Appears calm and comfortable, alert to voice, patient remains nonverbal which is his baseline Respiratory system: Clear to auscultation. Respiratory effort normal. Cardiovascular system: S1 & S2 heard, RRR. No JVD, murmurs, rubs, gallops or clicks. No pedal edema. Gastrointestinal system: Abdomen is nondistended, soft and nontender. No organomegaly or masses felt. Normal bowel sounds heard. Central nervous system: Alert  Extremities: Right AKA with dry and clean dressing   Data Reviewed: I have personally reviewed following labs and imaging studies  CBC: Recent Labs  Lab 08/19/18 1820 08/21/18 0243 08/23/18 0300  WBC 15.1*  14.4* 17.6*  HGB 12.4* 12.2* 10.4*  HCT 38.2* 36.7* 31.5*  MCV 92.5 91.3 93.2  PLT 221 245 246   Basic Metabolic Panel: Recent Labs  Lab 08/19/18 1820 08/22/18 0227 08/23/18 0300  NA 132* 137 138  K 4.4 3.8 3.9  CL 96* 103 106  CO2 23 23 22   GLUCOSE 237* 173* 194*  BUN 14 18 19   CREATININE 0.87 1.94* 1.98*  CALCIUM 8.6* 8.5* 8.0*   GFR: Estimated Creatinine Clearance: 37.7 mL/min (A) (by C-G formula based on SCr of 1.98 mg/dL (H)). Liver Function Tests: Recent Labs  Lab 08/19/18 1820  AST 29  ALT 19  ALKPHOS 57  BILITOT 0.9  PROT 6.4*  ALBUMIN 2.6*   No results for input(s): LIPASE, AMYLASE in the last 168 hours. No results for input(s): AMMONIA in the last 168 hours. Coagulation Profile: No results for input(s): INR, PROTIME in the last 168 hours. Cardiac Enzymes: No results for input(s): CKTOTAL, CKMB, CKMBINDEX, TROPONINI in the last 168 hours. BNP (last 3 results) No results for input(s): PROBNP in the last 8760 hours. HbA1C: No results for input(s): HGBA1C in  the last 72 hours. CBG: Recent Labs  Lab 08/22/18 1652 08/22/18 1723 08/22/18 2059 08/23/18 0753 08/23/18 1142  GLUCAP 75 77 165* 136* 161*   Lipid Profile: No results for input(s): CHOL, HDL, LDLCALC, TRIG, CHOLHDL, LDLDIRECT in the last 72 hours. Thyroid Function Tests: No results for input(s): TSH, T4TOTAL, FREET4, T3FREE, THYROIDAB in the last 72 hours. Anemia Panel: No results for input(s): VITAMINB12, FOLATE, FERRITIN, TIBC, IRON, RETICCTPCT in the last 72 hours. Sepsis Labs: Recent Labs  Lab 08/19/18 1937 08/19/18 2200  LATICACIDVEN 3.6* 1.8    Recent Results (from the past 240 hour(s))  MRSA PCR Screening     Status: None   Collection Time: 08/19/18  5:52 PM  Result Value Ref Range Status   MRSA by PCR NEGATIVE NEGATIVE Final    Comment:        The GeneXpert MRSA Assay (FDA approved for NASAL specimens only), is one component of a comprehensive MRSA  colonization surveillance program. It is not intended to diagnose MRSA infection nor to guide or monitor treatment for MRSA infections. Performed at [facility], [address] [place], [place] [address]        Radiology Studies: Mr Maxine Glenn Lower Extremity Left Wo Contrast  Result Date: 08/22/2018 CLINICAL DATA:  Fibromuscular dysplasia of the renal arteries EXAM: MR MRA OF THE RIGHT LOWER EXTREMITY WITHOUT CONTRAST; MR MRA OF THE LEFT LOWER EXTREMITY WITHOUT CONTRAST TECHNIQUE: Multiplanar, multiecho pulse sequences of the bilateral lower extremities were obtained without intravenous contrast. Angiographic images of bilateral lower extremities were obtained using MRA technique without intravenous contrast. COMPARISON:  None. FINDINGS: Noncontrast MR imaging is submitted. Imaging is suboptimal for the evaluation of fibromuscular dysplasia as small webs can easily be missed by this technique. Distal aorta is patent.  IMA is patent. Bilateral common, internal, and external iliac arteries are patent. Right common femoral, superficial femoral, and profunda femoral arteries are patent. Right popliteal artery is patent. The anterior tibial artery is patent to the foot. There is apparent narrowing in the anterior tibial artery within the mid leg. See image 85 of series 12. The peroneal artery is occluded. The posterior tibial artery is patent to the distal leg. Left common femoral and profunda femoral arteries are patent. There is artifact obscuring the proximal left superficial femoral artery. There is poor visualization of the left superficial femoral artery secondary to artifact. The distal left superficial femoral artery is patent. The proximal and mid superficial femoral artery is not adequately evaluated. Left popliteal artery is patent. The anterior tibial artery is patent to the foot. IMPRESSION: The examination is limited in the evaluation of fibromuscular dysplasia as described above. No  significant aortic or iliac arterial occlusive disease. Right femoral and popliteal arterial system is patent. There is at least 1 vessel runoff via the anterior tibial artery to the foot. There is suspected narrowing in the anterior tibial artery within the mid leg. The left common femoral and profunda femoral arteries are patent. The proximal and mid left superficial femoral artery is inadequately evaluated due to artifact. The distal left superficial femoral artery is patent. Left popliteal artery is patent. There is at least 1 vessel runoff to the foot via the anterior tibial artery. Electronically Signed   By: Jolaine Click M.D.   On: 08/22/2018 07:50   Mr Maxine Glenn Lower Extremity Right Wo Contrast  Result Date: 08/22/2018 CLINICAL DATA:  Fibromuscular dysplasia of the renal arteries EXAM: MR MRA OF THE RIGHT LOWER EXTREMITY WITHOUT CONTRAST;  MR MRA OF THE LEFT LOWER EXTREMITY WITHOUT CONTRAST TECHNIQUE: Multiplanar, multiecho pulse sequences of the bilateral lower extremities were obtained without intravenous contrast. Angiographic images of bilateral lower extremities were obtained using MRA technique without intravenous contrast. COMPARISON:  None. FINDINGS: Noncontrast MR imaging is submitted. Imaging is suboptimal for the evaluation of fibromuscular dysplasia as small webs can easily be missed by this technique. Distal aorta is patent.  IMA is patent. Bilateral common, internal, and external iliac arteries are patent. Right common femoral, superficial femoral, and profunda femoral arteries are patent. Right popliteal artery is patent. The anterior tibial artery is patent to the foot. There is apparent narrowing in the anterior tibial artery within the mid leg. See image 85 of series 12. The peroneal artery is occluded. The posterior tibial artery is patent to the distal leg. Left common femoral and profunda femoral arteries are patent. There is artifact obscuring the proximal left superficial femoral artery.  There is poor visualization of the left superficial femoral artery secondary to artifact. The distal left superficial femoral artery is patent. The proximal and mid superficial femoral artery is not adequately evaluated. Left popliteal artery is patent. The anterior tibial artery is patent to the foot. IMPRESSION: The examination is limited in the evaluation of fibromuscular dysplasia as described above. No significant aortic or iliac arterial occlusive disease. Right femoral and popliteal arterial system is patent. There is at least 1 vessel runoff via the anterior tibial artery to the foot. There is suspected narrowing in the anterior tibial artery within the mid leg. The left common femoral and profunda femoral arteries are patent. The proximal and mid left superficial femoral artery is inadequately evaluated due to artifact. The distal left superficial femoral artery is patent. Left popliteal artery is patent. There is at least 1 vessel runoff to the foot via the anterior tibial artery. Electronically Signed   By: Jolaine ClickArthur  Hoss M.D.   On: 08/22/2018 07:50      Scheduled Meds: . aspirin  81 mg Oral Daily  . cholecalciferol  2,000 Units Oral Daily  . divalproex  125 mg Oral Q8H  . docusate sodium  100 mg Oral BID  . donepezil  10 mg Oral QHS  . enoxaparin (LOVENOX) injection  40 mg Subcutaneous Q24H  . feeding supplement (GLUCERNA SHAKE)  237 mL Oral TID BM  . feeding supplement (PRO-STAT SUGAR FREE 64)  30 mL Oral BID  . insulin aspart  0-5 Units Subcutaneous QHS  . insulin aspart  0-9 Units Subcutaneous TID WC  . insulin detemir  25 Units Subcutaneous BID  . memantine  10 mg Oral BID  . pantoprazole  40 mg Oral Daily  . polyethylene glycol  17 g Oral Daily  . senna  2 tablet Oral QHS  . traZODone  25 mg Oral QHS   Continuous Infusions: . magnesium sulfate 1 - 4 g bolus IVPB       LOS: 4 days    Time spent: 35 minutes   Noralee StainJennifer Candence Sease, DO [facility] [email or web address] 08/23/2018, 1:37 PM

## 2018-08-24 LAB — CBC
HCT: 31.4 % — ABNORMAL LOW (ref 39.0–52.0)
Hemoglobin: 10.3 g/dL — ABNORMAL LOW (ref 13.0–17.0)
MCH: 31 pg (ref 26.0–34.0)
MCHC: 32.8 g/dL (ref 30.0–36.0)
MCV: 94.6 fL (ref 80.0–100.0)
PLATELETS: 215 10*3/uL (ref 150–400)
RBC: 3.32 MIL/uL — ABNORMAL LOW (ref 4.22–5.81)
RDW: 13.4 % (ref 11.5–15.5)
WBC: 12.5 10*3/uL — ABNORMAL HIGH (ref 4.0–10.5)
nRBC: 0 % (ref 0.0–0.2)

## 2018-08-24 LAB — BASIC METABOLIC PANEL
Anion gap: 10 (ref 5–15)
BUN: 20 mg/dL (ref 8–23)
CALCIUM: 8.1 mg/dL — AB (ref 8.9–10.3)
CO2: 22 mmol/L (ref 22–32)
Chloride: 108 mmol/L (ref 98–111)
Creatinine, Ser: 1.92 mg/dL — ABNORMAL HIGH (ref 0.61–1.24)
GFR calc Af Amer: 39 mL/min — ABNORMAL LOW (ref >60)
GFR calc non Af Amer: 34 mL/min — ABNORMAL LOW (ref >60)
Glucose, Bld: 198 mg/dL — ABNORMAL HIGH (ref 70–99)
Potassium: 3.9 mmol/L (ref 3.5–5.1)
Sodium: 140 mmol/L (ref 135–145)

## 2018-08-24 LAB — GLUCOSE, CAPILLARY
GLUCOSE-CAPILLARY: 186 mg/dL — AB (ref 70–99)
Glucose-Capillary: 146 mg/dL — ABNORMAL HIGH (ref 70–99)
Glucose-Capillary: 140 mg/dL — ABNORMAL HIGH (ref 70–99)
Glucose-Capillary: 216 mg/dL — ABNORMAL HIGH (ref 70–99)

## 2018-08-24 MED ORDER — SODIUM CHLORIDE 0.9 % IV SOLN
INTRAVENOUS | Status: AC
  Administered 2018-08-24 (×2): via INTRAVENOUS

## 2018-08-24 NOTE — Clinical Social Work Note (Signed)
CSW has [facility]. [facility] if [facility] [place].  Des PeresBridget Kenda Kloehn, [place] [phone]

## 2018-08-24 NOTE — Progress Notes (Signed)
PROGRESS NOTE    Raymond Hunter  HQI:[id number] DOB: [birth year] DOA: 08/19/2018 PCP: [name], [place], MD     Brief Narrative:  Raymond Hunter is a 73 year old male, resident of [facility] in [place], , bedbound for several weeks now, prior to that mobile with the help of a wheelchair, initially transferred from SNF to [facility] on 08/18/2018 due to2black right foot toes with surrounding redness. He was evaluated to have sepsis due to potential right diabetic foot infection and possible UTI. He was treated per sepsis protocol with IV fluids and IV vancomycin and Zosyn. Due to concern for critical right limb ischemia and need for further evaluation by vascular surgery at a tertiary center, he was transferred to [facility] on 1/25.  Vascular surgery consulted and after PMT input, underwent right AKA on 1/28.  Hospital course complicated by acute kidney injury, possibly related to vancomycin toxicity.  New events last 24 hours / Subjective: No new events   Assessment & Plan:   Principal Problem:   Gangrene of toe of right foot (HCC) Active Problems:   Cellulitis of foot, right   Diabetic infection of right foot (HCC)   Sepsis (HCC)   Alzheimer's dementia (HCC)   Type 2 diabetes mellitus with peripheral neuropathy (HCC)   Essential hypertension   Urinary retention   Neurogenic bladder   Pressure injury of skin   Goals of care, counseling/discussion   Palliative care by specialist   S/P AKA (above knee amputation), right (HCC)   Sepsis secondary to diabetic right foot infection/gangrene of the right foot with PAD  -Vascular surgery following -Status post right AKA 1/28 -IV antibiotics stopped now that infectious source has been removed  Acute kidney injury -Creatinine increased from 0.87 on 1/25 --> 1.94 on 1/28.  Bladder scan shows no urinary retention and he has a Foley catheter.  Acute kidney injury likely related to vancomycin toxicity.  Discontinued lisinopril, vancomycin. Start IVF today and watch Cr   E. coli CAUTI versus asymptomatic bacteriuria -Urine culture from [facility] showed >100 K of E. coli, pansensitive and insignificant colonies of Proteus -Given Zosyn for 4 days   BPH/neurogenic bladder/urinary retention/possible CAUTI -Patient used to have in and out catheterization done 3 times daily for a couple of years until recently. Now has indwelling catheter for the last week. Urinary catheter was [facility] on day of admission. Patient unable to provide history of dysuria  Type II DM with peripheral neuropathy -Levemir and add NovoLog SSI  Essential hypertension -Lisinopril discontinued due to acute kidney injury   Advanced Alzheimer's dementia -Continue Aricept, Namenda and Depakote.  Mental status likely at baseline.  Adult failure to thrive -Multifactorial secondary to advanced dementia complicated by multiple severe significant medical problems and advanced age.  PMT input 1/26 appreciated: DNR confirmed, no PEG tube or artificial feeding, would like to proceed with right AKA, palliative team will continue to follow, hospice at discharge.   DVT prophylaxis: Lovenox Code Status: DNR Family Communication: No family at bedside, spoke with daughter over the phone with updates  Disposition Plan: Back to SNF hopefully 1/31 if Cr improves/stable    Consultants:   Vascular surgery  Palliative care medicine    Antimicrobials:  Anti-infectives (From admission, onward)   Start     Dose/Rate Route Frequency Ordered Stop   08/22/18 0817  vancomycin variable dose per unstable renal function (pharmacist dosing)  Status:  Discontinued      Does not apply  See admin instructions 08/22/18 0817 08/23/18 0732   08/21/18 0900  vancomycin (VANCOCIN) IVPB 1000 mg/200 mL premix  Status:  Discontinued     1,000 mg 200 mL/hr over 60 Minutes Intravenous Every 12 hours 08/21/18 0837  08/22/18 0813   08/20/18 0800  vancomycin (VANCOCIN) 1,500 mg in sodium chloride 0.9 % 500 mL IVPB  Status:  Discontinued     1,500 mg 250 mL/hr over 120 Minutes Intravenous Every 12 hours 08/19/18 2014 08/21/18 0837   08/20/18 0400  piperacillin-tazobactam (ZOSYN) IVPB 3.375 g  Status:  Discontinued     3.375 g 12.5 mL/hr over 240 Minutes Intravenous Every 8 hours 08/19/18 2014 08/23/18 0732   08/19/18 2015  vancomycin (VANCOCIN) 2,000 mg in sodium chloride 0.9 % 500 mL IVPB     2,000 mg 250 mL/hr over 120 Minutes Intravenous  Once 08/19/18 2011 08/20/18 0624   08/19/18 2000  piperacillin-tazobactam (ZOSYN) IVPB 3.375 g     3.375 g 100 mL/hr over 30 Minutes Intravenous  Once 08/19/18 1950 08/20/18 0624       Objective: Vitals:   08/23/18 1720 08/23/18 2300 08/24/18 0500 08/24/18 0847  BP: 103/77 (!) 110/59  (!) 120/45  Pulse: 81 66  92  Resp:  18  18  Temp: 100.2 F (37.9 C) 98 F (36.7 C)  97.8 F (36.6 C)  TempSrc:  Oral    SpO2: 96% 96%  96%  Weight:   92.1 kg   Height:        Intake/Output Summary (Last 24 hours) at 08/24/2018 1121 Last data filed at 08/24/2018 0600 Gross per 24 hour  Intake 300 ml  Output 1325 ml  Net -1025 ml   Filed Weights   08/21/18 0500 08/22/18 1340 08/24/18 0500  Weight: 94.3 kg 94.3 kg 92.1 kg    Examination: General exam: Appears calm and comfortable, alert, nonverbal  Respiratory system: Clear to auscultation. Respiratory effort normal. Cardiovascular system: S1 & S2 heard, RRR. No JVD, murmurs, rubs, gallops or clicks. No pedal edema. Gastrointestinal system: Abdomen is nondistended, soft and nontender. No organomegaly or masses felt. Normal bowel sounds heard. Central nervous system: Alert  Extremities: Right AKA with clean and dry incision with staples in place  Skin: No rashes, lesions or ulcers   Data Reviewed: I have personally reviewed following labs and imaging studies  CBC: Recent Labs  Lab 08/19/18 1820  08/21/18 0243 08/23/18 0300 08/24/18 0306  WBC 15.1* 14.4* 17.6* 12.5*  HGB 12.4* 12.2* 10.4* 10.3*  HCT 38.2* 36.7* 31.5* 31.4*  MCV 92.5 91.3 93.2 94.6  PLT 221 245 246 215   Basic Metabolic Panel: Recent Labs  Lab 08/19/18 1820 08/22/18 0227 08/23/18 0300 08/24/18 0306  NA 132* 137 138 140  K 4.4 3.8 3.9 3.9  CL 96* 103 106 108  CO2 23 23 22 22   GLUCOSE 237* 173* 194* 198*  BUN 14 18 19 20   CREATININE 0.87 1.94* 1.98* 1.92*  CALCIUM 8.6* 8.5* 8.0* 8.1*   GFR: Estimated Creatinine Clearance: 38.5 mL/min (A) (by C-G formula based on SCr of 1.92 mg/dL (H)). Liver Function Tests: Recent Labs  Lab 08/19/18 1820  AST 29  ALT 19  ALKPHOS 57  BILITOT 0.9  PROT 6.4*  ALBUMIN 2.6*   No results for input(s): LIPASE, AMYLASE in the last 168 hours. No results for input(s): AMMONIA in the last 168 hours. Coagulation Profile: No results for input(s): INR, PROTIME in the last 168 hours. Cardiac Enzymes: No results for input(s):  CKTOTAL, CKMB, CKMBINDEX, TROPONINI in the last 168 hours. BNP (last 3 results) No results for input(s): PROBNP in the last 8760 hours. HbA1C: No results for input(s): HGBA1C in the last 72 hours. CBG: Recent Labs  Lab 08/23/18 0753 08/23/18 1142 08/23/18 1726 08/23/18 2144 08/24/18 0724  GLUCAP 136* 161* 178* 195* 146*   Lipid Profile: No results for input(s): CHOL, HDL, LDLCALC, TRIG, CHOLHDL, LDLDIRECT in the last 72 hours. Thyroid Function Tests: No results for input(s): TSH, T4TOTAL, FREET4, T3FREE, THYROIDAB in the last 72 hours. Anemia Panel: No results for input(s): VITAMINB12, FOLATE, FERRITIN, TIBC, IRON, RETICCTPCT in the last 72 hours. Sepsis Labs: Recent Labs  Lab 08/19/18 1937 08/19/18 2200  LATICACIDVEN 3.6* 1.8    Recent Results (from the past 240 hour(s))  MRSA PCR Screening     Status: None   Collection Time: 08/19/18  5:52 PM  Result Value Ref Range Status   MRSA by PCR NEGATIVE NEGATIVE Final    Comment:         The GeneXpert MRSA Assay (FDA approved for NASAL specimens only), is one component of a comprehensive MRSA colonization surveillance program. It is not intended to diagnose MRSA infection nor to guide or monitor treatment for MRSA infections. Performed at [facility], [address], [place], [place] [address]   Culture, blood (routine x 2)     Status: None (Preliminary result)   Collection Time: 08/23/18  1:57 PM  Result Value Ref Range Status   Specimen Description BLOOD RIGHT ANTECUBITAL  Final   Special Requests   Final    AEROBIC BOTTLE ONLY Blood Culture results may not be optimal due to an inadequate volume of blood received in culture bottles   Culture   Final    NO GROWTH < 24 HOURS Performed at [facility], [address] [place], [place] [address]    Report Status PENDING  Incomplete  Culture, blood (routine x 2)     Status: None (Preliminary result)   Collection Time: 08/23/18  2:04 PM  Result Value Ref Range Status   Specimen Description BLOOD BLOOD RIGHT HAND  Final   Special Requests AEROBIC BOTTLE ONLY Blood Culture adequate volume  Final   Culture   Final    NO GROWTH < 24 HOURS Performed at [facility], [address], [place], [place] [address]    Report Status PENDING  Incomplete       Radiology Studies: No results found.    Scheduled Meds: . acetaminophen  1,000 mg Oral TID  . aspirin  81 mg Oral Daily  . cholecalciferol  2,000 Units Oral Daily  . divalproex  125 mg Oral Q8H  . docusate sodium  100 mg Oral BID  . donepezil  10 mg Oral QHS  . enoxaparin (LOVENOX) injection  40 mg Subcutaneous Q24H  . feeding supplement (GLUCERNA SHAKE)  237 mL Oral TID BM  . feeding supplement (PRO-STAT SUGAR FREE 64)  30 mL Oral BID  . insulin aspart  0-5 Units Subcutaneous QHS  . insulin aspart  0-9 Units Subcutaneous TID WC  . insulin detemir  25 Units Subcutaneous BID  . memantine  10 mg Oral BID  . pantoprazole  40 mg Oral Daily   . polyethylene glycol  17 g Oral Daily  . senna  2 tablet Oral QHS  . traZODone  25 mg Oral QHS   Continuous Infusions: . sodium chloride 75 mL/hr at 08/24/18 0854  .  magnesium sulfate 1 - 4 g bolus IVPB       LOS: 5 days    Time spent: 20 minutes   Noralee StainJennifer Aveena Bari, DO [facility] [email or web address] 08/24/2018, 11:21 AM

## 2018-08-24 NOTE — Progress Notes (Signed)
   Patient was being cleaned at my evaluation today.  I took his dressing down and residual limb appears to be healing well with staples in place.  We will set him up for follow-up in the office for staple removal in 3 to 4 weeks.  From a vascular standpoint he is okay for discharge.  Kraig Genis C. Randie Heinz, MD [facility] Office: [phone] Pager: [phone]

## 2018-08-25 ENCOUNTER — Inpatient Hospital Stay (HOSPITAL_COMMUNITY): Payer: Medicare Other

## 2018-08-25 LAB — BASIC METABOLIC PANEL
ANION GAP: 7 (ref 5–15)
BUN: 24 mg/dL — ABNORMAL HIGH (ref 8–23)
CO2: 23 mmol/L (ref 22–32)
Calcium: 8.1 mg/dL — ABNORMAL LOW (ref 8.9–10.3)
Chloride: 115 mmol/L — ABNORMAL HIGH (ref 98–111)
Creatinine, Ser: 1.92 mg/dL — ABNORMAL HIGH (ref 0.61–1.24)
GFR calc Af Amer: 39 mL/min — ABNORMAL LOW (ref >60)
GFR calc non Af Amer: 34 mL/min — ABNORMAL LOW (ref >60)
Glucose, Bld: 206 mg/dL — ABNORMAL HIGH (ref 70–99)
Potassium: 3.8 mmol/L (ref 3.5–5.1)
Sodium: 145 mmol/L (ref 135–145)

## 2018-08-25 LAB — CBC
HCT: 28.8 % — ABNORMAL LOW (ref 39.0–52.0)
Hemoglobin: 9.5 g/dL — ABNORMAL LOW (ref 13.0–17.0)
MCH: 31.5 pg (ref 26.0–34.0)
MCHC: 33 g/dL (ref 30.0–36.0)
MCV: 95.4 fL (ref 80.0–100.0)
Platelets: 217 10*3/uL (ref 150–400)
RBC: 3.02 MIL/uL — ABNORMAL LOW (ref 4.22–5.81)
RDW: 13.7 % (ref 11.5–15.5)
WBC: 12.2 10*3/uL — ABNORMAL HIGH (ref 4.0–10.5)
nRBC: 0 % (ref 0.0–0.2)

## 2018-08-25 LAB — URINALYSIS, ROUTINE W REFLEX MICROSCOPIC
BILIRUBIN URINE: NEGATIVE
Glucose, UA: NEGATIVE mg/dL
Ketones, ur: NEGATIVE mg/dL
Nitrite: NEGATIVE
Protein, ur: 100 mg/dL — AB
Specific Gravity, Urine: 1.023 (ref 1.005–1.030)
pH: 5 (ref 5.0–8.0)

## 2018-08-25 LAB — GLUCOSE, CAPILLARY
GLUCOSE-CAPILLARY: 161 mg/dL — AB (ref 70–99)
Glucose-Capillary: 163 mg/dL — ABNORMAL HIGH (ref 70–99)
Glucose-Capillary: 118 mg/dL — ABNORMAL HIGH (ref 70–99)
Glucose-Capillary: 122 mg/dL — ABNORMAL HIGH (ref 70–99)

## 2018-08-25 LAB — INFLUENZA PANEL BY PCR (TYPE A & B)
Influenza A By PCR: POSITIVE — AB
Influenza B By PCR: NEGATIVE

## 2018-08-25 MED ORDER — OSELTAMIVIR PHOSPHATE 30 MG PO CAPS
30.0000 mg | ORAL_CAPSULE | Freq: Two times a day (BID) | ORAL | Status: DC
  Administered 2018-08-25 – 2018-08-26 (×4): 30 mg via ORAL
  Filled 2018-08-25 (×5): qty 1

## 2018-08-25 MED ORDER — SODIUM CHLORIDE 0.9 % IV SOLN
INTRAVENOUS | Status: DC
  Administered 2018-08-25: 14:00:00 via INTRAVENOUS

## 2018-08-25 NOTE — Care Management Important Message (Signed)
Important Message  Patient Details  Name: Raymond Hunter MRN: [id number] Date of Birth: 1946/07/08   Medicare Important Message Given:  Yes    Leone Haven, RN 08/25/2018, 4:41 PM

## 2018-08-25 NOTE — Progress Notes (Signed)
Physical Therapy Treatment Patient Details Name: Raymond Hunter MRN: [id number] DOB: [birth year] Today's Date: 08/25/2018    History of Present Illness 73 year old male, resident of [facility] in [place], [place], bedbound for several weeks now, prior to that mobile with the help of a wheelchair, initially transferred from SNF to [facility] on 08/18/2018 due to 2 black right foot toes with surrounding redness.  He was evaluated to have sepsis due to potential right diabetic foot infection and possible UTI.  He was treated per sepsis protocol with IV fluids and IV vancomycin and Zosyn.  Due to concern for critical right limb ischemia and need for further evaluation by vascular surgery at a tertiary center, he was transferred to [facility] on 1/25.  Vascular surgery consulted.  now s/p AKA 08/22/18    PT Comments    Patient following less cues today than prior sesssion, unmotivated entirely too participate with therapy. Cont to rec SNF, no family present for caregiver education today. Will decrease frequency and follow.   Follow Up Recommendations  SNF     Equipment Recommendations       Recommendations for Other Services       Precautions / Restrictions Precautions Precautions: Fall Precaution Comments: at risk for skin breakdown Restrictions Weight Bearing Restrictions: No    Mobility  Bed Mobility Overal bed mobility: Needs Assistance Bed Mobility: Rolling Rolling: Max assist;+2 for physical assistance            Transfers                    Ambulation/Gait                 Stairs             Wheelchair Mobility    Modified Rankin (Stroke Patients Only)       Balance Overall balance assessment: Needs assistance   Sitting balance-Leahy Scale: Poor                                      Cognition Arousal/Alertness: Awake/alert Behavior During Therapy: Flat affect Overall Cognitive Status: History of  cognitive impairments - at baseline                                 General Comments: oriented to person only      Exercises      General Comments        Pertinent Vitals/Pain Pain Assessment: Faces Faces Pain Scale: Hurts a little bit Pain Location: generalized Pain Descriptors / Indicators: Grimacing Pain Intervention(s): Limited activity within patient's tolerance;Monitored during session;Premedicated before session    Home Living                      Prior Function            PT Goals (current goals can now be found in the care plan section) Acute Rehab PT Goals Patient Stated Goal: pt unable to state PT Goal Formulation: With patient Time For Goal Achievement: 09/03/18 Potential to Achieve Goals: Poor Progress towards PT goals: Progressing toward goals    Frequency    Min 2X/week      PT Plan Current plan remains appropriate    Co-evaluation              AM-PAC  PT "6 Clicks" Mobility   Outcome Measure  Help needed turning from your back to your side while in a flat bed without using bedrails?: Total Help needed moving from lying on your back to sitting on the side of a flat bed without using bedrails?: Total Help needed moving to and from a bed to a chair (including a wheelchair)?: Total Help needed standing up from a chair using your arms (e.g., wheelchair or bedside chair)?: Total Help needed to walk in hospital room?: Total Help needed climbing 3-5 steps with a railing? : Total 6 Click Score: 6    End of Session Equipment Utilized During Treatment: Gait belt Activity Tolerance: Treatment limited secondary to agitation Patient left: in bed;with call bell/phone within reach;with nursing/sitter in room;with family/visitor present Nurse Communication: Mobility status PT Visit Diagnosis: Unsteadiness on feet (R26.81)     Time: 4097-3532 PT Time Calculation (min) (ACUTE ONLY): 18 min  Charges:  $Therapeutic Activity:  8-22 mins                     Etta Grandchild, PT, DPT Acute Rehabilitation Services Pager: [phone] Office: [phone]    Sheron Ortt 08/25/2018, 2:06 PM

## 2018-08-25 NOTE — Progress Notes (Addendum)
PROGRESS NOTE    Raymond PeekDon S Joens  BJY:[id number] DOB: 05/31/1946 DOA: 08/19/2018 PCP: [name], [place], MD     Brief Narrative:  Raymond Hunter is a 73 year old male, resident of [facility] in [place], [place], bedbound for several weeks now, prior to that mobile with the help of a wheelchair, initially transferred from SNF to [facility] on 08/18/2018 due to2black right foot toes with surrounding redness. He was evaluated to have sepsis due to potential right diabetic foot infection and possible UTI. He was treated per sepsis protocol with IV fluids and IV vancomycin and Zosyn. Due to concern for critical right limb ischemia and need for further evaluation by vascular surgery at a tertiary center, he was [facility]  on 1/[facility].  Vascular surgery consulted and after PMT input, underwent right AKA on 1/28.  Hospital course complicated by acute kidney injury, possibly related to vancomycin toxicity.  New events last 24 hours / Subjective: Fever this morning 101.7. Patient alert, remains nonverbal. Tested positive for influenza A.   Assessment & Plan:   Principal Problem:   Gangrene of toe of right foot (HCC) Active Problems:   Cellulitis of foot, right   Diabetic infection of right foot (HCC)   Sepsis (HCC)   Alzheimer's dementia (HCC)   Type 2 diabetes mellitus with peripheral neuropathy (HCC)   Essential hypertension   Urinary retention   Neurogenic bladder   Pressure injury of skin   Goals of care, counseling/discussion   Palliative care by specialist   S/P AKA (above knee amputation), right (HCC)   Sepsis secondary to diabetic right foot infection/gangrene of the right foot with PAD  -Vascular surgery following -Status post right AKA 1/28 -IV antibiotics stopped now that infectious source has been removed  Influenza A -Fever this morning 101.7.  He did drop down to 88% on room air.  Blood cultures obtained, influenza positive.  Tamiflu started.     Acute hypoxemic respiratory failure -Chest x-ray independently reviewed without any focal consolidations noted -Continue nasal cannula O2 to maintain SPO2 greater than 88%  Acute kidney injury -Creatinine increased from 0.87 on 1/25 --> 1.94 on 1/28.  Bladder scan shows no urinary retention and he has a Foley catheter.  Acute kidney injury likely related to vancomycin toxicity. Discontinued lisinopril, vancomycin. Stable today, continue IVF. Check UA  E. coli CAUTI versus asymptomatic bacteriuria -U[facility] from [facility] showed >100 K of E. coli, pansensitive and insignificant colonies of Proteus -Given Zosyn for 4 days   BPH/neurogenic bladder/urinary retention/possible CAUTI -Patient used to have in and out catheterization done 3 times daily for a couple of years until recently. Now has indwelling catheter for the last week. Urinary catheter was [facility] on day of admission. Patient unable to provide history of dysuria  Type II DM with peripheral neuropathy -Levemir and add NovoLog SSI  Essential hypertension -Lisinopril discontinued due to acute kidney injury   Advanced Alzheimer's dementia -Continue Aricept, Namenda and Depakote.  Mental status likely at baseline.  Adult failure to thrive -Multifactorial secondary to advanced dementia complicated by multiple severe significant medical problems and advanced age.  PMT input 1/26 appreciated: DNR confirmed, no PEG tube or artificial feeding, palliative team will continue to follow, hospice at discharge.   DVT prophylaxis: Lovenox Code Status: DNR Family Communication: No family at bedside, spoke with daughter over the phone with updates  Disposition Plan: Back to SNF hopefully 2/1 if remains afebrile   Consultants:  Vascular surgery  Palliative care medicine    Antimicrobials:  Anti-infectives (From admission, onward)   Start     Dose/Rate Route Frequency Ordered Stop    08/25/18 1230  oseltamivir (TAMIFLU) capsule 30 mg     30 mg Oral 2 times daily 08/25/18 1227 08/30/18 0959   08/22/18 0817  vancomycin variable dose per unstable renal function (pharmacist dosing)  Status:  Discontinued      Does not apply See admin instructions 08/22/18 0817 08/23/18 0732   08/21/18 0900  vancomycin (VANCOCIN) IVPB 1000 mg/200 mL premix  Status:  Discontinued     1,000 mg 200 mL/hr over 60 Minutes Intravenous Every 12 hours 08/21/18 0837 08/22/18 0813   08/20/18 0800  vancomycin (VANCOCIN) 1,500 mg in sodium chloride 0.9 % 500 mL IVPB  Status:  Discontinued     1,500 mg 250 mL/hr over 120 Minutes Intravenous Every 12 hours 08/19/18 2014 08/21/18 0837   08/20/18 0400  piperacillin-tazobactam (ZOSYN) IVPB 3.375 g  Status:  Discontinued     3.375 g 12.5 mL/hr over 240 Minutes Intravenous Every 8 hours 08/19/18 2014 08/23/18 0732   08/19/18 2015  vancomycin (VANCOCIN) 2,000 mg in sodium chloride 0.9 % 500 mL IVPB     2,000 mg 250 mL/hr over 120 Minutes Intravenous  Once 08/19/18 2011 08/20/18 0624   08/19/18 2000  piperacillin-tazobactam (ZOSYN) IVPB 3.375 g     3.375 g 100 mL/hr over 30 Minutes Intravenous  Once 08/19/18 1950 08/20/18 0624       Objective: Vitals:   08/24/18 2334 08/25/18 0759 08/25/18 0900 08/25/18 1200  BP: (!) 115/42 (!) 108/40    Pulse: 72 72  67  Resp:      Temp: 98.2 F (36.8 C) (!) 101.7 F (38.7 C) 100.1 F (37.8 C) 99.5 F (37.5 C)  TempSrc: Oral Oral Oral Oral  SpO2: 93% (!) 88%  94%  Weight:      Height:        Intake/Output Summary (Last 24 hours) at 08/25/2018 1230 Last data filed at 08/25/2018 0730 Gross per 24 hour  Intake 120 ml  Output 900 ml  Net -780 ml   Filed Weights   08/21/18 0500 08/22/18 1340 08/24/18 0500  Weight: 94.3 kg 94.3 kg 92.1 kg    Examination: General exam: Appears calm and comfortable, alert, nonverbal Respiratory system: Clear to auscultation. Respiratory effort normal.  On nasal cannula  O2 Cardiovascular system: S1 & S2 heard, RRR. No JVD, murmurs, rubs, gallops or clicks. No pedal edema. Gastrointestinal system: Abdomen is nondistended, soft and nontender. No organomegaly or masses felt. Normal bowel sounds heard. Central nervous system: Alert  Extremities: Right AKA with staples in place, no erythema or drainage from incision  Data Reviewed: I have personally reviewed following labs and imaging studies  CBC: Recent Labs  Lab 08/19/18 1820 08/21/18 0243 08/23/18 0300 08/24/18 0306 08/25/18 0226  WBC 15.1* 14.4* 17.6* 12.5* 12.2*  HGB 12.4* 12.2* 10.4* 10.3* 9.5*  HCT 38.2* 36.7* 31.5* 31.4* 28.8*  MCV 92.5 91.3 93.2 94.6 95.4  PLT 221 245 246 215 217   Basic Metabolic Panel: Recent Labs  Lab 08/19/18 1820 08/22/18 0227 08/23/18 0300 08/24/18 0306 08/25/18 0226  NA 132* 137 138 140 145  K 4.4 3.8 3.9 3.9 3.8  CL 96* 103 106 108 115*  CO2 23 23 22 22 23   GLUCOSE 237* 173* 194* 198* 206*  BUN 14 18 19 20  24*  CREATININE 0.87 1.94* 1.98* 1.92* 1.92*  CALCIUM 8.6* 8.5* 8.0* 8.1* 8.1*   GFR: Estimated Creatinine Clearance: 38.5 mL/min (A) (by C-G formula based on SCr of 1.92 mg/dL (H)). Liver Function Tests: Recent Labs  Lab 08/19/18 1820  AST 29  ALT 19  ALKPHOS 57  BILITOT 0.9  PROT 6.4*  ALBUMIN 2.6*   No results for input(s): LIPASE, AMYLASE in the last 168 hours. No results for input(s): AMMONIA in the last 168 hours. Coagulation Profile: No results for input(s): INR, PROTIME in the last 168 hours. Cardiac Enzymes: No results for input(s): CKTOTAL, CKMB, CKMBINDEX, TROPONINI in the last 168 hours. BNP (last 3 results) No results for input(s): PROBNP in the last 8760 hours. HbA1C: No results for input(s): HGBA1C in the last 72 hours. CBG: Recent Labs  Lab 08/24/18 1245 08/24/18 1639 08/24/18 2119 08/25/18 0800 08/25/18 1154  GLUCAP 186* 140* 216* 163* 161*   Lipid Profile: No results for input(s): CHOL, HDL, LDLCALC, TRIG,  CHOLHDL, LDLDIRECT in the last 72 hours. Thyroid Function Tests: No results for input(s): TSH, T4TOTAL, FREET4, T3FREE, THYROIDAB in the last 72 hours. Anemia Panel: No results for input(s): VITAMINB12, FOLATE, FERRITIN, TIBC, IRON, RETICCTPCT in the last 72 hours. Sepsis Labs: Recent Labs  Lab 08/19/18 1937 08/19/18 2200  LATICACIDVEN 3.6* 1.8    Recent Results (from the past 240 hour(s))  MRSA PCR Screening     Status: None   Collection Time: 08/19/18  5:52 PM  Result Value Ref Range Status   MRSA by PCR NEGATIVE NEGATIVE Final    Comment:        The GeneXpert MRSA Assay (FDA approved for NASAL specimens only), is one component of a comprehensive MRSA colonization surveillance program. It is not intended to diagnose MRSA infection nor to guide or monitor treatment for MRSA infections. Performed at [facility], [address] [place], [place] [address]   Culture, blood (routine x 2)     Status: None (Preliminary result)   Collection Time: 08/23/18  1:57 PM  Result Value Ref Range Status   Specimen Description BLOOD RIGHT ANTECUBITAL  Final   Special Requests   Final    AEROBIC BOTTLE ONLY Blood Culture results may not be optimal due to an inadequate volume of blood received in culture bottles   Culture   Final    NO GROWTH 2 DAYS Performed at [facility], [address] [place], [place] [address]    Report Status PENDING  Incomplete  Culture, blood (routine x 2)     Status: None (Preliminary result)   Collection Time: 08/23/18  2:04 PM  Result Value Ref Range Status   Specimen Description BLOOD BLOOD RIGHT HAND  Final   Special Requests AEROBIC BOTTLE ONLY Blood Culture adequate volume  Final   Culture   Final    NO GROWTH 2 DAYS Performed at [facility], [address], [place], [place] [address]    Report Status PENDING  Incomplete       Radiology Studies: No results found.    Scheduled Meds: . acetaminophen  1,000 mg Oral TID  .  aspirin  81 mg Oral Daily  . cholecalciferol  2,000 Units Oral Daily  . divalproex  125 mg Oral Q8H  . docusate sodium  100 mg Oral BID  . donepezil  10 mg Oral QHS  . enoxaparin (LOVENOX) injection  40 mg Subcutaneous Q24H  . feeding supplement (GLUCERNA SHAKE)  237 mL Oral TID BM  . feeding supplement (  PRO-STAT SUGAR FREE 64)  30 mL Oral BID  . insulin aspart  0-5 Units Subcutaneous QHS  . insulin aspart  0-9 Units Subcutaneous TID WC  . insulin detemir  25 Units Subcutaneous BID  . memantine  10 mg Oral BID  . oseltamivir  30 mg Oral BID  . pantoprazole  40 mg Oral Daily  . polyethylene glycol  17 g Oral Daily  . senna  2 tablet Oral QHS  . traZODone  25 mg Oral QHS   Continuous Infusions: . sodium chloride    . magnesium sulfate 1 - 4 g bolus IVPB       LOS: 6 days    Time spent: 35 minutes   Noralee Stain, DO [facility] [email or web address] 08/25/2018, 12:30 PM

## 2018-08-25 NOTE — Progress Notes (Signed)
   Left above-knee amputation site appears to be healing well.  He has follow-up with me on February 28.  Okay for discharge from vascular standpoint.  There are questions over the weekend please contact on-call vascular surgeon.  Arienna Benegas C. Randie Heinz, MD [facility] Office: [phone] Pager: [phone]

## 2018-08-26 LAB — BASIC METABOLIC PANEL
Anion gap: 8 (ref 5–15)
BUN: 28 mg/dL — ABNORMAL HIGH (ref 8–23)
CO2: 22 mmol/L (ref 22–32)
Calcium: 8.1 mg/dL — ABNORMAL LOW (ref 8.9–10.3)
Chloride: 116 mmol/L — ABNORMAL HIGH (ref 98–111)
Creatinine, Ser: 1.91 mg/dL — ABNORMAL HIGH (ref 0.61–1.24)
GFR calc Af Amer: 39 mL/min — ABNORMAL LOW (ref >60)
GFR, EST NON AFRICAN AMERICAN: 34 mL/min — AB (ref >60)
GLUCOSE: 153 mg/dL — AB (ref 70–99)
Potassium: 3.8 mmol/L (ref 3.5–5.1)
Sodium: 146 mmol/L — ABNORMAL HIGH (ref 135–145)

## 2018-08-26 LAB — CBC
HCT: 27.1 % — ABNORMAL LOW (ref 39.0–52.0)
Hemoglobin: 8.8 g/dL — ABNORMAL LOW (ref 13.0–17.0)
MCH: 31.2 pg (ref 26.0–34.0)
MCHC: 32.5 g/dL (ref 30.0–36.0)
MCV: 96.1 fL (ref 80.0–100.0)
Platelets: 206 10*3/uL (ref 150–400)
RBC: 2.82 MIL/uL — ABNORMAL LOW (ref 4.22–5.81)
RDW: 13.9 % (ref 11.5–15.5)
WBC: 7.6 10*3/uL (ref 4.0–10.5)
nRBC: 0 % (ref 0.0–0.2)

## 2018-08-26 LAB — GLUCOSE, CAPILLARY
Glucose-Capillary: 133 mg/dL — ABNORMAL HIGH (ref 70–99)
Glucose-Capillary: 211 mg/dL — ABNORMAL HIGH (ref 70–99)
Glucose-Capillary: 165 mg/dL — ABNORMAL HIGH (ref 70–99)
Glucose-Capillary: 126 mg/dL — ABNORMAL HIGH (ref 70–99)

## 2018-08-26 LAB — URINE CULTURE: Culture: NO GROWTH

## 2018-08-26 MED ORDER — OSELTAMIVIR PHOSPHATE 30 MG PO CAPS: 30.0000 mg | ORAL_CAPSULE | Freq: Two times a day (BID) | ORAL | 0 refills | Status: DC

## 2018-08-26 NOTE — Progress Notes (Signed)
Pt is refusing mouth care.  Pt is holding his mouth shut tight and shaking his head no.

## 2018-08-26 NOTE — Progress Notes (Signed)
CSW received call from Admissions at [facility] in [place]. They are following to admit patient back, he will returning under hospice care. CSW to continue to follow for transition back to SNF when stable.  Blenda Nicely, [place] Clinical Social Worker [phone]

## 2018-08-26 NOTE — Progress Notes (Addendum)
PROGRESS NOTE    Raymond Hunter  GNF:[id number] DOB: Feb 14, 1946 DOA: 08/19/2018 PCP: Raymond Hunter, [place], MD     Brief Narrative:  Raymond PeekDon S Hunter is a 73 year old male, resident of [facility] in [place], [place], bedbound for several weeks now, prior to that mobile with the help of a wheelchair, initially transferred from SNF to [facility] on 08/18/2018 due to2black right foot toes with surrounding redness. He was evaluated to have sepsis due to potential right diabetic foot infection and possible UTI. He was treated per sepsis protocol with IV fluids and IV vancomycin and Zosyn. Due to concern for critical right limb ischemia and need for further evaluation by vascular surgery at a tertiary center, he was transferred to [facility] on 1/25.  Vascular surgery consulted and after PMT input, underwent right AKA on 1/28.  Hospital course complicated by acute kidney injury, possibly related to vancomycin toxicity.  New events last 24 hours / Subjective: No events overnight, remains afebrile.  Patient alert, nonverbal which is his baseline.  Assessment & Plan:   Principal Problem:   Gangrene of toe of right foot (HCC) Active Problems:   Cellulitis of foot, right   Diabetic infection of right foot (HCC)   Sepsis (HCC)   Alzheimer's dementia (HCC)   Type 2 diabetes mellitus with peripheral neuropathy (HCC)   Essential hypertension   Urinary retention   Neurogenic bladder   Pressure injury of skin   Goals of care, counseling/discussion   Palliative care by specialist   S/P AKA (above knee amputation), right (HCC)   Sepsis secondary to diabetic right foot infection/gangrene of the right foot with PAD  -Vascular surgery following -Status post right AKA 1/28 -IV antibiotics stopped now that infectious source has been removed  Influenza A -Afebrile last 24 hours. Blood cultures obtained, influenza positive.  Tamiflu started 1/31  Acute hypoxemic respiratory  failure -Chest x-ray independently reviewed without any focal consolidations noted -Continue nasal cannula O2 to maintain SPO2 greater than 88%.  Wean as able  Acute kidney injury -Creatinine increased from 0.87 on 1/25 --> 1.94 on 1/28.  Bladder scan shows no urinary retention and he has a Foley catheter.  Acute kidney injury likely related to vancomycin toxicity. Discontinued lisinopril, vancomycin. Stable today, will discontinue IVF  E. coli CAUTI versus asymptomatic bacteriuria -[facility] showed >100 K of E. coli, pansensitive and insignificant colonies of Proteus -Given Zosyn for 4 days   BPH/neurogenic bladder/urinary retention/possible CAUTI -Patient used to have in and out catheterization done 3 times daily for a couple of years until recently. Now has indwelling catheter for the last week. Urinary catheter was reportedly [facility] on day of admission. Patient unable to provide history of dysuria  Type II DM with peripheral neuropathy -Levemir and add NovoLog SSI  Essential hypertension -Lisinopril discontinued due to acute kidney injury   Advanced Alzheimer's dementia -Continue Aricept, Namenda and Depakote.  Mental status likely at baseline.  Adult failure to thrive -Multifactorial secondary to advanced dementia complicated by multiple severe significant medical problems and advanced age.  PMT input 1/26 appreciated: DNR confirmed, no PEG tube or artificial feeding, palliative team will continue to follow, hospice at discharge.   DVT prophylaxis: Lovenox Code Status: DNR Family Communication: No family at bedside, spoke with daughter over the phone 2/1  Disposition Plan: Back to SNF on Monday when they're able to take patient    Consultants:   Vascular surgery  Palliative care medicine  Antimicrobials:  Anti-infectives (From admission, onward)   Start     Dose/Rate Route Frequency Ordered Stop   08/25/18 1300   oseltamivir (TAMIFLU) capsule 30 mg     30 mg Oral 2 times daily 08/25/18 1227 08/30/18 0959   08/22/18 0817  vancomycin variable dose per unstable renal function (pharmacist dosing)  Status:  Discontinued      Does not apply See admin instructions 08/22/18 0817 08/23/18 0732   08/21/18 0900  vancomycin (VANCOCIN) IVPB 1000 mg/200 mL premix  Status:  Discontinued     1,000 mg 200 mL/hr over 60 Minutes Intravenous Every 12 hours 08/21/18 0837 08/22/18 0813   08/20/18 0800  vancomycin (VANCOCIN) 1,500 mg in sodium chloride 0.9 % 500 mL IVPB  Status:  Discontinued     1,500 mg 250 mL/hr over 120 Minutes Intravenous Every 12 hours 08/19/18 2014 08/21/18 0837   08/20/18 0400  piperacillin-tazobactam (ZOSYN) IVPB 3.375 g  Status:  Discontinued     3.375 g 12.5 mL/hr over 240 Minutes Intravenous Every 8 hours 08/19/18 2014 08/23/18 0732   08/19/18 2015  vancomycin (VANCOCIN) 2,000 mg in sodium chloride 0.9 % 500 mL IVPB     2,000 mg 250 mL/hr over 120 Minutes Intravenous  Once 08/19/18 2011 08/20/18 0624   08/19/18 2000  piperacillin-tazobactam (ZOSYN) IVPB 3.375 g     3.375 g 100 mL/hr over 30 Minutes Intravenous  Once 08/19/18 1950 08/20/18 0624       Objective: Vitals:   08/25/18 1200 08/25/18 1631 08/25/18 2300 08/26/18 0800  BP:  (!) 117/55 (!) 130/55 134/60  Pulse: 67 65 76 66  Resp:   17   Temp: 99.5 F (37.5 C) 100.3 F (37.9 C) 98.5 F (36.9 C) 98.9 F (37.2 C)  TempSrc: Oral Oral Oral Oral  SpO2: 94% 96% 95% 94%  Weight:      Height:        Intake/Output Summary (Last 24 hours) at 08/26/2018 1005 Last data filed at 08/26/2018 0644 Gross per 24 hour  Intake 13.63 ml  Output 650 ml  Net -636.37 ml   Filed Weights   08/21/18 0500 08/22/18 1340 08/24/18 0500  Weight: 94.3 kg 94.3 kg 92.1 kg    Examination: General exam: Appears calm and comfortable, alert Respiratory system: Clear to auscultation. Respiratory effort normal. Cardiovascular system: S1 & S2 heard, RRR.  No JVD, murmurs, rubs, gallops or clicks. No pedal edema. Gastrointestinal system: Abdomen is nondistended, soft and nontender. No organomegaly or masses felt. Normal bowel sounds heard. Central nervous system: Alert  Extremities: Right AKA Skin: Incision over right AKA with staples in place, clean and dry without drainage  Data Reviewed: I have personally reviewed following labs and imaging studies  CBC: Recent Labs  Lab 08/21/18 0243 08/23/18 0300 08/24/18 0306 08/25/18 0226 08/26/18 0302  WBC 14.4* 17.6* 12.5* 12.2* 7.6  HGB 12.2* 10.4* 10.3* 9.5* 8.8*  HCT 36.7* 31.5* 31.4* 28.8* 27.1*  MCV 91.3 93.2 94.6 95.4 96.1  PLT 245 246 215 217 206   Basic Metabolic Panel: Recent Labs  Lab 08/22/18 0227 08/23/18 0300 08/24/18 0306 08/25/18 0226 08/26/18 0302  NA 137 138 140 145 146*  K 3.8 3.9 3.9 3.8 3.8  CL 103 106 108 115* 116*  CO2 23 22 22 23 22   GLUCOSE 173* 194* 198* 206* 153*  BUN 18 19 20  24* 28*  CREATININE 1.94* 1.98* 1.92* 1.92* 1.91*  CALCIUM 8.5* 8.0* 8.1* 8.1* 8.1*   GFR: Estimated Creatinine Clearance:  38.7 mL/min (A) (by C-G formula based on SCr of 1.91 mg/dL (H)). Liver Function Tests: Recent Labs  Lab 08/19/18 1820  AST 29  ALT 19  ALKPHOS 57  BILITOT 0.9  PROT 6.4*  ALBUMIN 2.6*   No results for input(s): LIPASE, AMYLASE in the last 168 hours. No results for input(s): AMMONIA in the last 168 hours. Coagulation Profile: No results for input(s): INR, PROTIME in the last 168 hours. Cardiac Enzymes: No results for input(s): CKTOTAL, CKMB, CKMBINDEX, TROPONINI in the last 168 hours. BNP (last 3 results) No results for input(s): PROBNP in the last 8760 hours. HbA1C: No results for input(s): HGBA1C in the last 72 hours. CBG: Recent Labs  Lab 08/25/18 0800 08/25/18 1154 08/25/18 1538 08/25/18 2232 08/26/18 0758  GLUCAP 163* 161* 118* 122* 133*   Lipid Profile: No results for input(s): CHOL, HDL, LDLCALC, TRIG, CHOLHDL, LDLDIRECT in the  last 72 hours. Thyroid Function Tests: No results for input(s): TSH, T4TOTAL, FREET4, T3FREE, THYROIDAB in the last 72 hours. Anemia Panel: No results for input(s): VITAMINB12, FOLATE, FERRITIN, TIBC, IRON, RETICCTPCT in the last 72 hours. Sepsis Labs: Recent Labs  Lab 08/19/18 1937 08/19/18 2200  LATICACIDVEN 3.6* 1.8    Recent Results (from the past 240 hour(s))  MRSA PCR Screening     Status: None   Collection Time: 08/19/18  5:52 PM  Result Value Ref Range Status   MRSA by PCR NEGATIVE NEGATIVE Final    Comment:        The GeneXpert MRSA Assay (FDA approved for NASAL specimens only), is one component of a comprehensive MRSA colonization surveillance program. It is not intended to diagnose MRSA infection nor to guide or monitor treatment for MRSA infections. Performed at [facility], [address] [place], [place] [address]   Culture, blood (routine x 2)     Status: None (Preliminary result)   Collection Time: 08/23/18  1:57 PM  Result Value Ref Range Status   Specimen Description BLOOD RIGHT ANTECUBITAL  Final   Special Requests   Final    AEROBIC BOTTLE ONLY Blood Culture results may not be optimal due to an inadequate volume of blood received in culture bottles   Culture NO GROWTH 3 DAYS  Final   Report Status PENDING  Incomplete  Culture, blood (routine x 2)     Status: None (Preliminary result)   Collection Time: 08/23/18  2:04 PM  Result Value Ref Range Status   Specimen Description BLOOD BLOOD RIGHT HAND  Final   Special Requests AEROBIC BOTTLE ONLY Blood Culture adequate volume  Final   Culture NO GROWTH 3 DAYS  Final   Report Status PENDING  Incomplete  Culture, blood (routine x 2)     Status: None (Preliminary result)   Collection Time: 08/25/18 10:39 AM  Result Value Ref Range Status   Specimen Description BLOOD LEFT HAND  Final   Special Requests   Final    BOTTLES DRAWN AEROBIC AND ANAEROBIC Blood Culture adequate volume Performed at [facility], [address] [place], [place] [address]    Culture NO GROWTH < 24 HOURS  Final   Report Status PENDING  Incomplete  Culture, blood (routine x 2)     Status: None (Preliminary result)   Collection Time: 08/25/18 11:02 AM  Result Value Ref Range Status   Specimen Description BLOOD RIGHT HAND  Final   Special Requests   Final    BOTTLES DRAWN AEROBIC AND ANAEROBIC Blood  Culture adequate volume Performed at [facility], [address] [place], [place] [address]    Culture NO GROWTH < 24 HOURS  Final   Report Status PENDING  Incomplete       Radiology Studies: Dg Chest Port 1 View  Result Date: 08/25/2018 CLINICAL DATA:  Fever. EXAM: PORTABLE CHEST 1 VIEW COMPARISON:  Radiograph of August 18, 2018. FINDINGS: The heart size and mediastinal contours are within normal limits. No pneumothorax or pleural effusion is noted. Minimal bibasilar subsegmental atelectasis is noted. The visualized skeletal structures are unremarkable. IMPRESSION: Minimal bibasilar subsegmental atelectasis. Electronically Signed   By: Lupita RaiderJames  Green Jr, M.D.   On: 08/25/2018 13:16      Scheduled Meds: . acetaminophen  1,000 mg Oral TID  . aspirin  81 mg Oral Daily  . cholecalciferol  2,000 Units Oral Daily  . divalproex  125 mg Oral Q8H  . docusate sodium  100 mg Oral BID  . donepezil  10 mg Oral QHS  . enoxaparin (LOVENOX) injection  40 mg Subcutaneous Q24H  . feeding supplement (GLUCERNA SHAKE)  237 mL Oral TID BM  . feeding supplement (PRO-STAT SUGAR FREE 64)  30 mL Oral BID  . insulin aspart  0-5 Units Subcutaneous QHS  . insulin aspart  0-9 Units Subcutaneous TID WC  . insulin detemir  25 Units Subcutaneous BID  . memantine  10 mg Oral BID  . oseltamivir  30 mg Oral BID  . pantoprazole  40 mg Oral Daily  . polyethylene glycol  17 g Oral Daily  . senna  2 tablet Oral QHS  . traZODone  25 mg Oral QHS   Continuous Infusions:    LOS: 7 days    Time spent: 20 minutes    Noralee StainJennifer Roey Coopman, DO [facility] [email or web address] 08/26/2018, 10:05 AM

## 2018-08-27 LAB — BASIC METABOLIC PANEL
ANION GAP: 8 (ref 5–15)
BUN: 31 mg/dL — ABNORMAL HIGH (ref 8–23)
CALCIUM: 8.1 mg/dL — AB (ref 8.9–10.3)
CO2: 22 mmol/L (ref 22–32)
Chloride: 119 mmol/L — ABNORMAL HIGH (ref 98–111)
Creatinine, Ser: 1.78 mg/dL — ABNORMAL HIGH (ref 0.61–1.24)
GFR calc Af Amer: 43 mL/min — ABNORMAL LOW (ref >60)
GFR calc non Af Amer: 37 mL/min — ABNORMAL LOW (ref >60)
Glucose, Bld: 144 mg/dL — ABNORMAL HIGH (ref 70–99)
Potassium: 3.7 mmol/L (ref 3.5–5.1)
Sodium: 149 mmol/L — ABNORMAL HIGH (ref 135–145)

## 2018-08-27 LAB — GLUCOSE, CAPILLARY: Glucose-Capillary: 104 mg/dL — ABNORMAL HIGH (ref 70–99)

## 2018-08-27 MED ORDER — OXYCODONE HCL ER 10 MG PO T12A: 10.0000 mg | EXTENDED_RELEASE_TABLET | Freq: Two times a day (BID) | ORAL | 0 refills | Status: AC

## 2018-08-27 MED ORDER — TRAMADOL HCL 50 MG PO TABS: 50.0000 mg | ORAL_TABLET | Freq: Four times a day (QID) | ORAL | 0 refills | Status: AC | PRN

## 2018-08-27 MED ORDER — OSELTAMIVIR PHOSPHATE 30 MG PO CAPS: 30.0000 mg | ORAL_CAPSULE | Freq: Two times a day (BID) | ORAL | 0 refills | Status: AC

## 2018-08-27 NOTE — Discharge Summary (Signed)
Physician Discharge Summary  Raymond Hunter BJY:[id number] DOB: April 05, 1946 DOA: 08/19/2018  PCP: Charlott Rakes, MD  Admit date: 08/19/2018 Discharge date: 08/27/2018  Admitted From: SNF Disposition: SNF under hospice care   Recommendations for Outpatient Follow-up:  1. Follow up with Dr. Randie Heinz 2/28  Discharge Condition: Stable CODE STATUS: DNR  Diet recommendation: Regular   Brief/Interim Summary: Raymond Hunter is a 73 year old male, [facility] [place], [place], bedbound for several weeks now, prior to that mobile with the help of a wheelchair, initially transferred from SNF to [facility] on 08/18/2018 due to2black right foot toes with surrounding redness. He was evaluated to have sepsis due to potential right diabetic foot infection and possible UTI. He was treated per sepsis protocol with IV fluids and IV vancomycin and Zosyn. Due to concern for critical right limb ischemia and need for further evaluation by vascular surgery at a tertiary center, he was transferred to [facility] on 1/25. 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 Hospital course complicated by acute kidney injury, possibly related to vancomycin toxicity.  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 Blood cultures negative to date, influenza positive.  Tamiflu started 1/31  Acute hypoxemic respiratory failure -Chest x-ray independently reviewed without any focal consolidations noted -Continue nasal cannula O2 to maintain SPO2 greater than 88%.  Wean as able  Acute kidney injury -Creatinine increased from 0.87 on 1/25 --> 1.94 on 1/28. Bladder scan shows no urinary retention and he has a Foley catheter. Acute  kidney injury likely related to vancomycin toxicity.Discontinued lisinopril, vancomycin. Cr improved.   E. coli CAUTI versus asymptomatic bacteriuria -Urine culture from [facility] showed >100 K of E. coli, pansensitive and insignificant colonies of Proteus -Given Zosyn for 4 days   BPH/neurogenic bladder/urinary retention/possible CAUTI -Patient used to have in and out catheterization done 3 times daily for a couple of years until recently. Now has indwelling catheter for the last week. Urinary catheter was reportedly changed at [facility] on day of admission. Patient unable to provide history of dysuria  Type II DM with peripheral neuropathy -Levemir and add NovoLog SSI  Essential hypertension -Lisinopril discontinued due to acute kidney injury   Advanced Alzheimer's dementia -Continue Aricept, Namenda and Depakote. Mental status likely at baseline.  Adult failure to thrive -Multifactorial secondary to advanced dementia complicated by multiple severe significant medical problems and advanced age. PMT input 1/26 appreciated: DNR confirmed, no PEG tube or artificial feeding, palliative team will continue to follow, hospice at discharge.   Discharge Instructions  Discharge Instructions    Call MD for:  difficulty breathing, headache or visual disturbances   Complete by:  As directed    Call MD for:  extreme fatigue   Complete by:  As directed    Call MD for:  hives   Complete by:  As directed    Call MD for:  persistant dizziness or light-headedness   Complete by:  As directed    Call MD for:  persistant nausea and vomiting   Complete by:  As directed    Call MD for:  redness, tenderness, or signs of infection (pain, swelling, redness, odor or green/yellow discharge around incision site)   Complete by:  As directed    Call  MD for:  severe uncontrolled pain   Complete by:  As directed    Call MD for:  temperature >100.4   Complete by:  As directed     Increase activity slowly   Complete by:  As directed      Allergies as of 08/27/2018   Not on File     Medication List    STOP taking these medications   lisinopril 5 MG tablet Commonly known as:  PRINIVIL,ZESTRIL   LORazepam 0.5 MG tablet Commonly known as:  ATIVAN   metFORMIN 1000 MG tablet Commonly known as:  GLUCOPHAGE     TAKE these medications   aspirin 81 MG chewable tablet Chew 81 mg by mouth daily.   cholecalciferol 25 MCG (1000 UT) tablet Commonly known as:  VITAMIN D3 Take 2,000 Units by mouth daily.   divalproex 125 MG capsule Commonly known as:  DEPAKOTE SPRINKLE Take 125 mg by mouth every 8 (eight) hours.   donepezil 10 MG tablet Commonly known as:  ARICEPT Take 10 mg by mouth at bedtime.   insulin detemir 100 UNIT/ML injection Commonly known as:  LEVEMIR Inject 50-65 Units into the skin See admin instructions. Inject 65 units daily in the morning at 0600, and inject 50 units in the evening at 2100.   memantine 10 MG tablet Commonly known as:  NAMENDA Take 10 mg by mouth 2 (two) times daily.   oseltamivir 30 MG capsule Commonly known as:  TAMIFLU Take 1 capsule (30 mg total) by mouth 2 (two) times daily for 2 days.   oxyCODONE 10 mg 12 hr tablet Commonly known as:  OXYCONTIN Take 1 tablet (10 mg total) by mouth every 12 (twelve) hours for 7 days.   traMADol 50 MG tablet Commonly known as:  ULTRAM Take 1 tablet (50 mg total) by mouth every 6 (six) hours as needed for up to 7 days for moderate pain or severe pain.   traZODone 50 MG tablet Commonly known as:  DESYREL Take 25 mg by mouth at bedtime.      Follow-up Information    [name], Francisco, MD. Schedule an appointment as soon as possible for a visit in 1 week(s).   Specialty:  Family Medicine Contact information: [address] [place] [address] [phone]        Maeola Harmanain, Brandon Christopher, MD. Go on 09/22/2018.   Specialties:  Vascular Surgery,  Cardiology Contact information: [address] [place] [address] [phone]          Not on File  Consultations:  Vascular surgery  Palliative care medicine   Procedures/Studies: Mr Maxine GlennMra Lower Extremity Left Wo Contrast  Result Date: 08/22/2018 CLINICAL DATA:  Fibromuscular dysplasia of the renal arteries EXAM: MR MRA OF THE RIGHT LOWER EXTREMITY WITHOUT CONTRAST; MR MRA OF THE LEFT LOWER EXTREMITY WITHOUT CONTRAST TECHNIQUE: Multiplanar, multiecho pulse sequences of the bilateral lower extremities were obtained without intravenous contrast. Angiographic images of bilateral lower extremities were obtained using MRA technique without intravenous contrast. COMPARISON:  None. FINDINGS: Noncontrast MR imaging is submitted. Imaging is suboptimal for the evaluation of fibromuscular dysplasia as small webs can easily be missed by this technique. Distal aorta is patent.  IMA is patent. Bilateral common, internal, and external iliac arteries are patent. Right common femoral, superficial femoral, and profunda femoral arteries are patent. Right popliteal artery is patent. The anterior tibial artery is patent to the foot. There is apparent narrowing in the anterior tibial artery within the mid leg. See  image 85 of series 12. The peroneal artery is occluded. The posterior tibial artery is patent to the distal leg. Left common femoral and profunda femoral arteries are patent. There is artifact obscuring the proximal left superficial femoral artery. There is poor visualization of the left superficial femoral artery secondary to artifact. The distal left superficial femoral artery is patent. The proximal and mid superficial femoral artery is not adequately evaluated. Left popliteal artery is patent. The anterior tibial artery is patent to the foot. IMPRESSION: The examination is limited in the evaluation of fibromuscular dysplasia as described above. No significant aortic or iliac arterial occlusive  disease. Right femoral and popliteal arterial system is patent. There is at least 1 vessel runoff via the anterior tibial artery to the foot. There is suspected narrowing in the anterior tibial artery within the mid leg. The left common femoral and profunda femoral arteries are patent. The proximal and mid left superficial femoral artery is inadequately evaluated due to artifact. The distal left superficial femoral artery is patent. Left popliteal artery is patent. There is at least 1 vessel runoff to the foot via the anterior tibial artery. Electronically Signed   By: Jolaine Click M.D.   On: 08/22/2018 07:50   Mr Maxine Glenn Lower Extremity Right Wo Contrast  Result Date: 08/22/2018 CLINICAL DATA:  Fibromuscular dysplasia of the renal arteries EXAM: MR MRA OF THE RIGHT LOWER EXTREMITY WITHOUT CONTRAST; MR MRA OF THE LEFT LOWER EXTREMITY WITHOUT CONTRAST TECHNIQUE: Multiplanar, multiecho pulse sequences of the bilateral lower extremities were obtained without intravenous contrast. Angiographic images of bilateral lower extremities were obtained using MRA technique without intravenous contrast. COMPARISON:  None. FINDINGS: Noncontrast MR imaging is submitted. Imaging is suboptimal for the evaluation of fibromuscular dysplasia as small webs can easily be missed by this technique. Distal aorta is patent.  IMA is patent. Bilateral common, internal, and external iliac arteries are patent. Right common femoral, superficial femoral, and profunda femoral arteries are patent. Right popliteal artery is patent. The anterior tibial artery is patent to the foot. There is apparent narrowing in the anterior tibial artery within the mid leg. See image 85 of series 12. The peroneal artery is occluded. The posterior tibial artery is patent to the distal leg. Left common femoral and profunda femoral arteries are patent. There is artifact obscuring the proximal left superficial femoral artery. There is poor visualization of the left  superficial femoral artery secondary to artifact. The distal left superficial femoral artery is patent. The proximal and mid superficial femoral artery is not adequately evaluated. Left popliteal artery is patent. The anterior tibial artery is patent to the foot. IMPRESSION: The examination is limited in the evaluation of fibromuscular dysplasia as described above. No significant aortic or iliac arterial occlusive disease. Right femoral and popliteal arterial system is patent. There is at least 1 vessel runoff via the anterior tibial artery to the foot. There is suspected narrowing in the anterior tibial artery within the mid leg. The left common femoral and profunda femoral arteries are patent. The proximal and mid left superficial femoral artery is inadequately evaluated due to artifact. The distal left superficial femoral artery is patent. Left popliteal artery is patent. There is at least 1 vessel runoff to the foot via the anterior tibial artery. Electronically Signed   By: Jolaine Click M.D.   On: 08/22/2018 07:50   Dg Chest Port 1 View  Result Date: 08/25/2018 CLINICAL DATA:  Fever. EXAM: PORTABLE CHEST 1 VIEW COMPARISON:  Radiograph of August 18, 2018. FINDINGS: The heart size and mediastinal contours are within normal limits. No pneumothorax or pleural effusion is noted. Minimal bibasilar subsegmental atelectasis is noted. The visualized skeletal structures are unremarkable. IMPRESSION: Minimal bibasilar subsegmental atelectasis. Electronically Signed   By: Lupita Raider, M.D.   On: 08/25/2018 13:16       Discharge Exam: Vitals:   08/26/18 2315 08/27/18 0741  BP: 132/61 (!) 144/72  Pulse: 77 68  Resp: (!) 28 16  Temp: 100.1 F (37.8 C) 97.7 F (36.5 C)  SpO2: 90% 95%    General: Pt is easily arousable to verbal stimuli, appears comfortable without distress Cardiovascular: RRR, S1/S2 +, no rubs, no gallops Respiratory: CTA bilaterally, no wheezing, no rhonchi Abdominal: Soft, NT,  ND, bowel sounds + Extremities: no edema, no cyanosis, right AKA stump clean and dry with staples in place     The results of significant diagnostics from this hospitalization (including imaging, microbiology, ancillary and laboratory) are listed below for reference.     Microbiology: Recent Results (from the past 240 hour(s))  MRSA PCR Screening     Status: None   Collection Time: 08/19/18  5:52 PM  Result Value Ref Range Status   MRSA by PCR NEGATIVE NEGATIVE Final    Comment:        The GeneXpert MRSA Assay (FDA approved for NASAL specimens only), is one component of a comprehensive MRSA colonization surveillance program. It is not intended to diagnose MRSA infection nor to guide or monitor treatment for MRSA infections. Performed at [facility], [address], [place], [place] [address]   Culture, blood (routine x 2)     Status: None (Preliminary result)   Collection Time: 08/23/18  1:57 PM  Result Value Ref Range Status   Specimen Description BLOOD RIGHT ANTECUBITAL  Final   Special Requests   Final    AEROBIC BOTTLE ONLY Blood Culture results may not be optimal due to an inadequate volume of blood received in culture bottles   Culture NO GROWTH 4 DAYS  Final   Report Status PENDING  Incomplete  Culture, blood (routine x 2)     Status: None (Preliminary result)   Collection Time: 08/23/18  2:04 PM  Result Value Ref Range Status   Specimen Description BLOOD BLOOD RIGHT HAND  Final   Special Requests AEROBIC BOTTLE ONLY Blood Culture adequate volume  Final   Culture NO GROWTH 4 DAYS  Final   Report Status PENDING  Incomplete  Culture, blood (routine x 2)     Status: None (Preliminary result)   Collection Time: 08/25/18 10:39 AM  Result Value Ref Range Status   Specimen Description BLOOD LEFT HAND  Final   Special Requests   Final    BOTTLES DRAWN AEROBIC AND ANAEROBIC Blood Culture adequate volume Performed at [facility], [address],  [place], [place] [address]    Culture NO GROWTH 2 DAYS  Final   Report Status PENDING  Incomplete  Culture, blood (routine x 2)     Status: None (Preliminary result)   Collection Time: 08/25/18 11:02 AM  Result Value Ref Range Status   Specimen Description BLOOD RIGHT HAND  Final   Special Requests   Final    BOTTLES DRAWN AEROBIC AND ANAEROBIC Blood Culture adequate volume Performed at [facility], [address] [place], [place] [address]    Culture NO GROWTH 2 DAYS  Final   Report Status PENDING  Incomplete  Culture,  Urine     Status: None   Collection Time: 08/25/18 11:31 AM  Result Value Ref Range Status   Specimen Description URINE, CATHETERIZED  Final   Special Requests NONE  Final   Culture   Final    NO GROWTH Performed at [facility], [address] [place], [place] [address]    Report Status 08/26/2018 FINAL  Final     Labs: BNP (last 3 results) No results for input(s): BNP in the last 8760 hours. Basic Metabolic Panel: Recent Labs  Lab 08/23/18 0300 08/24/18 0306 08/25/18 0226 08/26/18 0302 08/27/18 0239  NA 138 140 145 146* 149*  K 3.9 3.9 3.8 3.8 3.7  CL 106 108 115* 116* 119*  CO2 22 22 23 22 22   GLUCOSE 194* 198* 206* 153* 144*  BUN 19 20 24* 28* 31*  CREATININE 1.98* 1.92* 1.92* 1.91* 1.78*  CALCIUM 8.0* 8.1* 8.1* 8.1* 8.1*   Liver Function Tests: No results for input(s): AST, ALT, ALKPHOS, BILITOT, PROT, ALBUMIN in the last 168 hours. No results for input(s): LIPASE, AMYLASE in the last 168 hours. No results for input(s): AMMONIA in the last 168 hours. CBC: Recent Labs  Lab 08/21/18 0243 08/23/18 0300 08/24/18 0306 08/25/18 0226 08/26/18 0302  WBC 14.4* 17.6* 12.5* 12.2* 7.6  HGB 12.2* 10.4* 10.3* 9.5* 8.8*  HCT 36.7* 31.5* 31.4* 28.8* 27.1*  MCV 91.3 93.2 94.6 95.4 96.1  PLT 245 246 215 217 206   Cardiac Enzymes: No results for input(s): CKTOTAL, CKMB, CKMBINDEX, TROPONINI in the last 168 hours. BNP: Invalid input(s):  POCBNP CBG: Recent Labs  Lab 08/26/18 0758 08/26/18 1157 08/26/18 1650 08/26/18 2058 08/27/18 0740  GLUCAP 133* 211* 165* 126* 104*   D-Dimer No results for input(s): DDIMER in the last 72 hours. Hgb A1c No results for input(s): HGBA1C in the last 72 hours. Lipid Profile No results for input(s): CHOL, HDL, LDLCALC, TRIG, CHOLHDL, LDLDIRECT in the last 72 hours. Thyroid function studies No results for input(s): TSH, T4TOTAL, T3FREE, THYROIDAB in the last 72 hours.  Invalid input(s): FREET3 Anemia work up No results for input(s): VITAMINB12, FOLATE, FERRITIN, TIBC, IRON, RETICCTPCT in the last 72 hours. Urinalysis    Component Value Date/Time   COLORURINE YELLOW 08/25/2018 0957   APPEARANCEUR CLOUDY (A) 08/25/2018 0957   LABSPEC 1.023 08/25/2018 0957   PHURINE 5.0 08/25/2018 0957   GLUCOSEU NEGATIVE 08/25/2018 0957   HGBUR SMALL (A) 08/25/2018 0957   BILIRUBINUR NEGATIVE 08/25/2018 0957   KETONESUR NEGATIVE 08/25/2018 0957   PROTEINUR 100 (A) 08/25/2018 0957   UROBILINOGEN 0.2 07/23/2007 0747   NITRITE NEGATIVE 08/25/2018 0957   LEUKOCYTESUR MODERATE (A) 08/25/2018 0957   Sepsis Labs Invalid input(s): PROCALCITONIN,  WBC,  LACTICIDVEN Microbiology Recent Results (from the past 240 hour(s))  MRSA PCR Screening     Status: None   Collection Time: 08/19/18  5:52 PM  Result Value Ref Range Status   MRSA by PCR NEGATIVE NEGATIVE Final    Comment:        The GeneXpert MRSA Assay (FDA approved for NASAL specimens only), is one component of a comprehensive MRSA colonization surveillance program. It is not intended to diagnose MRSA infection nor to guide or monitor treatment for MRSA infections. Performed at [facility], [address] [place], [place] [address]   Culture, blood (routine x 2)     Status: None (Preliminary result)   Collection Time: 08/23/18  1:57 PM  Result Value Ref Range Status  Specimen Description BLOOD RIGHT ANTECUBITAL  Final    Special Requests   Final    AEROBIC BOTTLE ONLY Blood Culture results may not be optimal due to an inadequate volume of blood received in culture bottles   Culture NO GROWTH 4 DAYS  Final   Report Status PENDING  Incomplete  Culture, blood (routine x 2)     Status: None (Preliminary result)   Collection Time: 08/23/18  2:04 PM  Result Value Ref Range Status   Specimen Description BLOOD BLOOD RIGHT HAND  Final   Special Requests AEROBIC BOTTLE ONLY Blood Culture adequate volume  Final   Culture NO GROWTH 4 DAYS  Final   Report Status PENDING  Incomplete  Culture, blood (routine x 2)     Status: None (Preliminary result)   Collection Time: 08/25/18 10:39 AM  Result Value Ref Range Status   Specimen Description BLOOD LEFT HAND  Final   Special Requests   Final    BOTTLES DRAWN AEROBIC AND ANAEROBIC Blood Culture adequate volume Performed at [facility], [address] [place], [place] [address]    Culture NO GROWTH 2 DAYS  Final   Report Status PENDING  Incomplete  Culture, blood (routine x 2)     Status: None (Preliminary result)   Collection Time: 08/25/18 11:02 AM  Result Value Ref Range Status   Specimen Description BLOOD RIGHT HAND  Final   Special Requests   Final    BOTTLES DRAWN AEROBIC AND ANAEROBIC Blood Culture adequate volume Performed at [facility], [address] [place], [place] [address]    Culture NO GROWTH 2 DAYS  Final   Report Status PENDING  Incomplete  Culture, Urine     Status: None   Collection Time: 08/25/18 11:31 AM  Result Value Ref Range Status   Specimen Description URINE, CATHETERIZED  Final   Special Requests NONE  Final   Culture   Final    NO GROWTH Performed at [facility], [address] [place], [place] [address]    Report Status 08/26/2018 FINAL  Final     Patient was seen and examined on the day of discharge and was found to be in stable condition. Time coordinating discharge: 35 minutes including assessment and  coordination of care, as well as examination of the patient.   SIGNED:  Noralee StainJennifer Kelina Beauchamp, DO [facility] [email or web address] 08/27/2018, 9:19 AM

## 2018-08-27 NOTE — Clinical Social Work Placement (Signed)
   CLINICAL SOCIAL WORK PLACEMENT  NOTE  Date:  08/27/2018  Patient Details  Name: Raymond Hunter MRN: [id number] Date of Birth: [birth year]  Clinical Social Work is seeking post-discharge placement for this patient at the Skilled  Nursing Facility level of care (*CSW will initial, date and re-position this form in  chart as items are completed):      Patient/family provided with [facility] Clinical Social Work Department's list of facilities offering this level of care within the geographic area requested by the patient (or if unable, by the patient's family).  Yes   Patient/family informed of their freedom to choose among providers that offer the needed level of care, that participate in Medicare, Medicaid or managed care program needed by the patient, have an available bed and are willing to accept the patient.      Patient/family informed of [facility]'s ownership interest in [facility] and [facility], as well as of the fact that they are under no obligation to receive care at these facilities.  PASRR submitted to EDS on       PASRR number received on 08/21/18     Existing PASRR number confirmed on 08/21/18     FL2 transmitted to all facilities in geographic area requested by pt/family on 08/21/18     FL2 transmitted to all facilities within larger geographic area on       Patient informed that his/her managed care company has contracts with or will negotiate with certain facilities, including the following:        Yes   Patient/family informed of bed offers received.  Patient chooses bed at [facility]     Physician recommends and patient chooses bed at      Patient to be transferred to [facility] on 08/27/18.  Patient to be transferred to facility by [facility]     Patient family notified on 08/27/18 of transfer.  Name of family member notified:  [name], daughter      PHYSICIAN       Additional Comment:     _______________________________________________ Eduard Roux, LCSWA 08/27/2018, 9:44 AM

## 2018-08-27 NOTE — Progress Notes (Signed)
Unable to reach facility for report.

## 2018-08-27 NOTE — Progress Notes (Addendum)
Patient will DC to: [facility]  DC Date: 08/27/2018 Family Notified: Gavin Pound  Transport By: Sharin Mons  RN, patient, and facility notified of DC. Discharge Summary sent to facility. RN given number for [phone], Room 304. DC packet on chart. Ambulance transport requested for patient.   Clinical Social Worker signing off.  Antony Blackbird, [facility] Clinical Social Worker [phone]

## 2018-08-27 NOTE — Plan of Care (Signed)
Adequate for discharge.

## 2018-08-27 NOTE — Plan of Care (Signed)
Resolved

## 2018-08-27 NOTE — Progress Notes (Signed)
Attempted report, no answer at facility. Will call back. Tammy SoursAngela Audriana Aldama

## 2018-08-28 DIAGNOSIS — Z89619 Acquired absence of unspecified leg above knee: Secondary | ICD-10-CM | POA: Diagnosis not present

## 2018-08-28 DIAGNOSIS — I739 Peripheral vascular disease, unspecified: Secondary | ICD-10-CM | POA: Diagnosis not present

## 2018-08-28 DIAGNOSIS — E1159 Type 2 diabetes mellitus with other circulatory complications: Secondary | ICD-10-CM | POA: Diagnosis not present

## 2018-08-28 DIAGNOSIS — I10 Essential (primary) hypertension: Secondary | ICD-10-CM | POA: Diagnosis not present

## 2018-08-28 LAB — CULTURE, BLOOD (ROUTINE X 2)
CULTURE: NO GROWTH
Culture: NO GROWTH
Special Requests: ADEQUATE

## 2018-08-30 DIAGNOSIS — R404 Transient alteration of awareness: Secondary | ICD-10-CM | POA: Diagnosis not present

## 2018-08-30 DIAGNOSIS — Z7401 Bed confinement status: Secondary | ICD-10-CM | POA: Diagnosis not present

## 2018-08-30 DIAGNOSIS — R4020 Unspecified coma: Secondary | ICD-10-CM | POA: Diagnosis not present

## 2018-08-30 LAB — CULTURE, BLOOD (ROUTINE X 2)
CULTURE: NO GROWTH
Culture: NO GROWTH
SPECIAL REQUESTS: ADEQUATE
Special Requests: ADEQUATE

## 2018-09-22 ENCOUNTER — Encounter: Payer: Self-pay | Admitting: Family

## 2018-09-22 ENCOUNTER — Encounter: Payer: Self-pay | Admitting: Vascular Surgery

## 2018-09-24 DEATH — deceased

## 2019-08-10 IMAGING — DX DG CHEST 1V PORT
1 series · 1 of 1 positions shown · non-contrast
Comparison: Radiograph August 18, 2018.

CLINICAL DATA: Fever.

EXAM:
PORTABLE CHEST 1 VIEW

[chest ap]
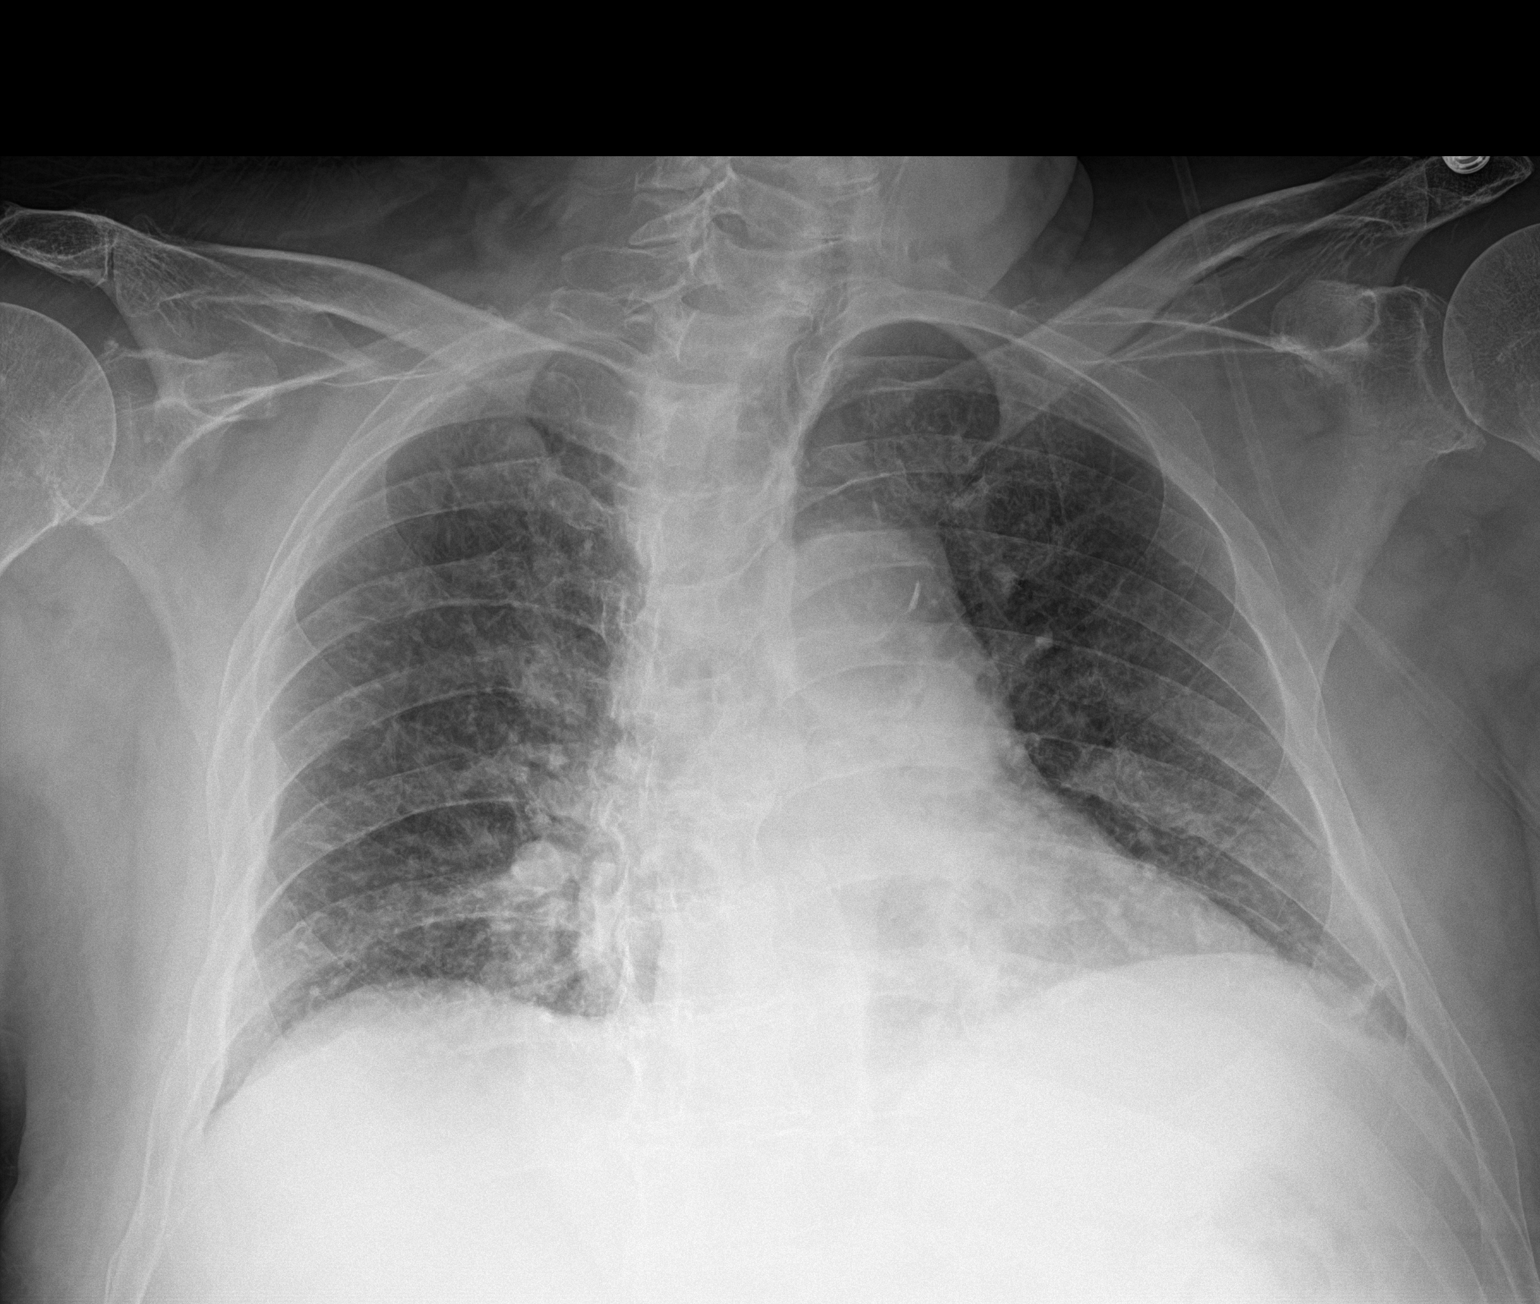

[1 of 1 positions shown; findings below may reference images not displayed]

FINDINGS: The heart size and mediastinal contours are within normal limits. No
pneumothorax or pleural effusion is noted. Minimal bibasilar
subsegmental atelectasis is noted. The visualized skeletal
structures are unremarkable.
IMPRESSION: Minimal bibasilar subsegmental atelectasis.
